# Patient Record
Sex: Male | Born: 1950 | Race: White | Hispanic: No | Smoking: Never smoker
Health system: Southern US, Community
[De-identification: ages and names within clinical notes are randomized; demographics above are authoritative.]

## PROBLEM LIST (undated history)

## (undated) DIAGNOSIS — K219 Gastro-esophageal reflux disease without esophagitis: Secondary | ICD-10-CM

## (undated) DIAGNOSIS — F419 Anxiety disorder, unspecified: Secondary | ICD-10-CM

## (undated) DIAGNOSIS — E782 Mixed hyperlipidemia: Secondary | ICD-10-CM

## (undated) DIAGNOSIS — M549 Dorsalgia, unspecified: Secondary | ICD-10-CM

## (undated) DIAGNOSIS — G8929 Other chronic pain: Secondary | ICD-10-CM

## (undated) DIAGNOSIS — I10 Essential (primary) hypertension: Secondary | ICD-10-CM

## (undated) HISTORY — DX: Anxiety disorder, unspecified: F41.9

## (undated) HISTORY — DX: Essential (primary) hypertension: I10

## (undated) HISTORY — DX: Other chronic pain: G89.29

## (undated) HISTORY — DX: Mixed hyperlipidemia: E78.2

## (undated) HISTORY — DX: Gastro-esophageal reflux disease without esophagitis: K21.9

## (undated) HISTORY — DX: Dorsalgia, unspecified: M54.9

---

## 2001-08-31 ENCOUNTER — Ambulatory Visit (HOSPITAL_COMMUNITY): Admission: RE | Admit: 2001-08-31 | Discharge: 2001-08-31 | Payer: Self-pay | Admitting: General Surgery

## 2003-06-03 ENCOUNTER — Emergency Department (HOSPITAL_COMMUNITY): Admission: EM | Admit: 2003-06-03 | Discharge: 2003-06-03 | Payer: Self-pay | Admitting: Emergency Medicine

## 2005-02-11 ENCOUNTER — Ambulatory Visit (HOSPITAL_COMMUNITY): Admission: RE | Admit: 2005-02-11 | Discharge: 2005-02-11 | Payer: Self-pay | Admitting: Family Medicine

## 2005-02-13 ENCOUNTER — Ambulatory Visit (HOSPITAL_COMMUNITY): Admission: RE | Admit: 2005-02-13 | Discharge: 2005-02-13 | Payer: Self-pay | Admitting: Family Medicine

## 2006-10-14 ENCOUNTER — Ambulatory Visit: Payer: Self-pay | Admitting: Sports Medicine

## 2006-10-14 DIAGNOSIS — M2390 Unspecified internal derangement of unspecified knee: Secondary | ICD-10-CM | POA: Insufficient documentation

## 2006-10-14 DIAGNOSIS — M7710 Lateral epicondylitis, unspecified elbow: Secondary | ICD-10-CM | POA: Insufficient documentation

## 2009-08-06 ENCOUNTER — Ambulatory Visit (HOSPITAL_COMMUNITY): Admission: RE | Admit: 2009-08-06 | Discharge: 2009-08-06 | Payer: Self-pay | Admitting: Family Medicine

## 2010-05-14 NOTE — Assessment & Plan Note (Signed)
Summary: new pt/sports related knee injury/el   Vital Signs:  Patient Profile:   60 Years Old Male Weight:      213 pounds Pulse rate:   73 / minute BP sitting:   139 / 91  Vitals Entered By: Lillia Pauls CMA (October 14, 2006 9:51 AM)               Chief Complaint:  NEW PT/R MEDIAL KNEE PAIN X 1 MONTH.  History of Present Illness: Patient presents for evaluation of right knee pain.  Reports sustaining a contusion to the medial aspect of his right knee 5 months ago - supportive care. Reports approx 4 weeks ago twisted his knee - into valgus - felt pain.  Noted swelling but no erythema, ecchymosis.  No mechanical symptoms.         Shoulder/Elbow Exam  Elbow Exam:    Right:    Inspection:  Normal    Palpation:  Abnormal       Location:  right lateral epicondyle    Tenderness:  right lateral epicondyle    Erythema:  no    pain with resisted wrist extension   Knee Exam  General:    Well-developed, well-nourished, in no acute distress; alert and oriented x 3.    Gait:    Normal heel-toe gait pattern bilaterally.    Skin:    Intact with no erythema; no scarring.    Inspection:    mild swelling: on right   Palpation:    tenderness R-medial joint line.    Vascular:    dorsalis pedis and posterior tibial pulses 2+ and symmetric, capillary refill < 2 seconds, normal hair pattern, no evidence of ischemia.   Knee Exam:    Right:    Inspection/Palpation:  mild effusion, no erythema, no ecchymosis.  Positive flexion pinch, positive medial joint line tenderness.  McMurrays equivical.  Neg Lachman, ant drawer.  Knee stable to valgus and varus stress testing.    Left:    Inspection:  Normal    Palpation:  Normal    Impression & Recommendations:  Problem # 1:  INTERNAL DERANGEMENT, RIGHT KNEE (ICD-717.9) Assessment: New 1.  Right knee brace - given to patient - to be used with activity. 2.  NSAID - Voltaren 75 mg by mouth two times a day two times a day with food  x 2 weeks then as needed. 3.  Glucosamine Chondrotin daily. 4.  Activity modification, icing two times a day. 5.  RTC 4-6 weeks.  Problem # 2:  LATERAL EPICONDYLITIS, RIGHT (ICD-726.32) 1.  right counterforce strap. 2.  NSAID - Voltaren 75 mg by mouth two times a day two times a day with food x 2 weeks then as needed. 3.  Activity modification, icing two times a day. 4.  RTC 4-6 weeks.          Appended Document: new pt/sports related knee injury/el seen with dr. Zachery Dauer.  agree with plan//r bassett

## 2010-08-30 NOTE — H&P (Signed)
NAME:  Matthew Cohen, Matthew Cohen                          ACCOUNT NO.:  [id number]   MEDICAL RECORD NO.:  [id number]                   PATIENT TYPE:  EMS   LOCATION:  ED                                   FACILITY:  [facility]   PHYSICIAN:  Madelin Rear. Sherwood Gambler, M.D.             DATE OF BIRTH:  [birth year]   DATE OF ADMISSION:  06/03/2003  DATE OF DISCHARGE:                                HISTORY & PHYSICAL   CHIEF COMPLAINT:  Chest pain.   HISTORY OF PRESENT ILLNESS:  The patient had two days of intermittent left  upper extremity discomfort and chest discomfort. It spontaneously waxed and  waned. However, today it got worse with much pressure-like sensation  describing it as a ton of bricks in the center of my chest.  He denied any  associated cardiopulmonary symptoms of palpitations, syncope, nausea,  vomiting, or diaphoresis. No shortness of breath, cough, or sputum. He has  had no hematochezia or melena. No nausea or vomiting. Regarding cardiac risk  factors, he denies cigarette smoking. Family history has been negative.   PAST MEDICAL HISTORY:  Gastroesophageal reflux disease treated well with  antacids and without recurrence. He denies recent increase in reflux  symptomatology. He was noted to have recently exerted himself and during  that exertion moving a daughter from one location to another did a lot of  walking, etc., and noted no exertional onset of the chest discomfort.   SOCIAL HISTORY:  Does not smoke, drink, or use other drugs.   FAMILY HISTORY:  Positive for diabetes mellitus, but otherwise  noncontributory.   REVIEW OF SYSTEMS:  Under HPI and negative in detail.   PHYSICAL EXAMINATION:  GENERAL: Awake, alert, pleasant, and pain free.  HEAD/NECK:  No JVD or adenopathy. Neck is supple.  CHEST: Clear.  CARDIAC: Regular rate and rhythm without murmurs, rubs, or gallops. Carotids  are free of bruits.  ABDOMEN: Soft. No organomegaly or masses. No bruits or palpable masses. No  increased liver, spleen, or kidney.  EXTREMITIES: Without clubbing, cyanosis, or edema.  NEUROLOGIC: Nonfocal.   LABORATORY DATA:  Electrocardiogram reveals a normal sinus rhythm and within  normal limits.  Chest x-ray was obtained, portable technique, and was  negative.   Initial cardiac enzymes were negative.  CBC was unremarkable.  Electrolytes  were normal.   IMPRESSION:  Unexplained chest pain in a middle-age male. Must rule out new  onset angina versus unstable angina versus non-Q-wave infarct. His  electrocardiogram is done with no pain and is within normal limits in spite  of the computer reading of reading an anteroseptal infarct. His reflux  disease may be relevant since esophageal spasm  is in the differential diagnostic possibilities or reflux. He will be begun  on proton pump inhibitor and started on antiplatelet therapy with aspirin in  anticipation of any coronary artery disease. We will admit him for rule out  myocardial infarction protocol, a  consultation with cardiology as indicated  by his clinical status.     ___________________________________________                                         Madelin Rear. Sherwood Gambler, M.D.   LJF/MEDQ  D:  06/03/2003  T:  06/03/2003  Job:  [id number]

## 2011-05-18 IMAGING — US US SCROTUM
1 series · 14 of 25 positions shown · non-contrast
Comparison: None.

CLINICAL DATA: Right testicular pain/history of right inguinal
hernia

SCROTAL ULTRASOUND
DOPPLER ULTRASOUND OF THE TESTICLES
TECHNIQUE: Complete ultrasound examination of the testicles,
epididymis, and other scrotal structures was performed.  Color and
spectral Doppler ultrasound were also utilized to evaluate blood
flow to the testicles.

[Series 1: us scrotum · 0.08mm/px · 74 acquisitions, 14 frames shown]
[im 1/74]
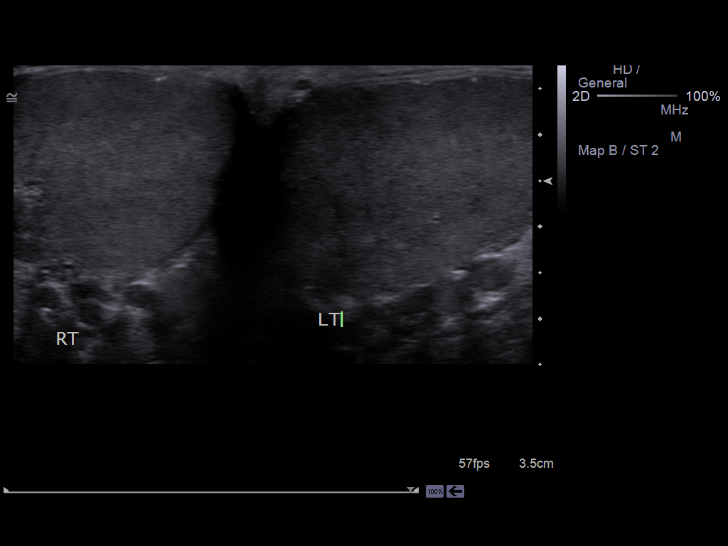
[im 7/74]
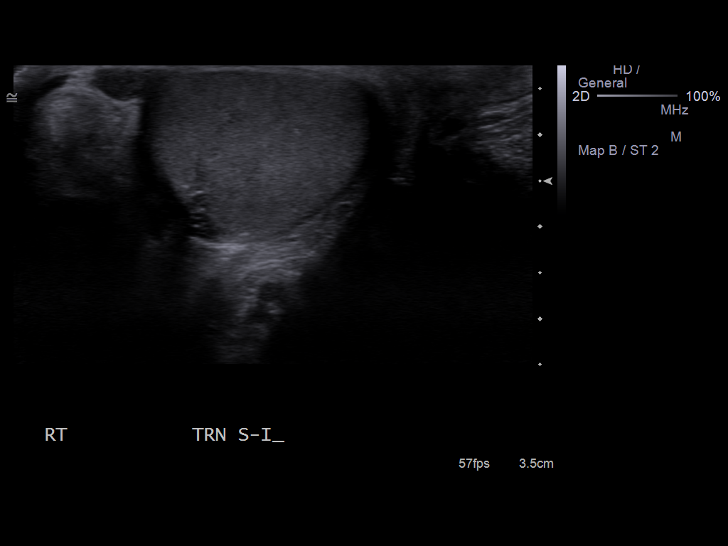
[im 13/74]
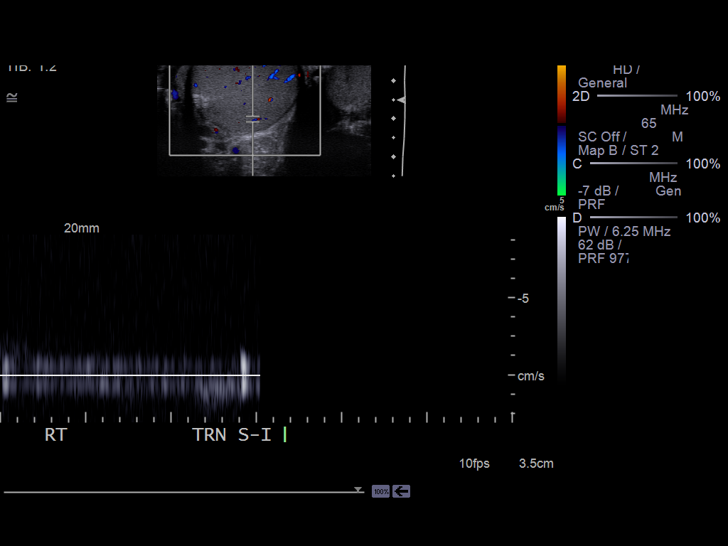
[im 19/74]
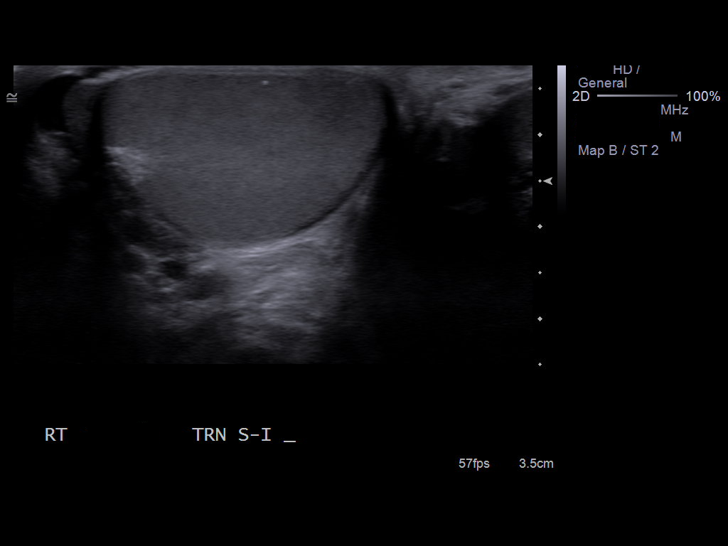
[im 25/74]
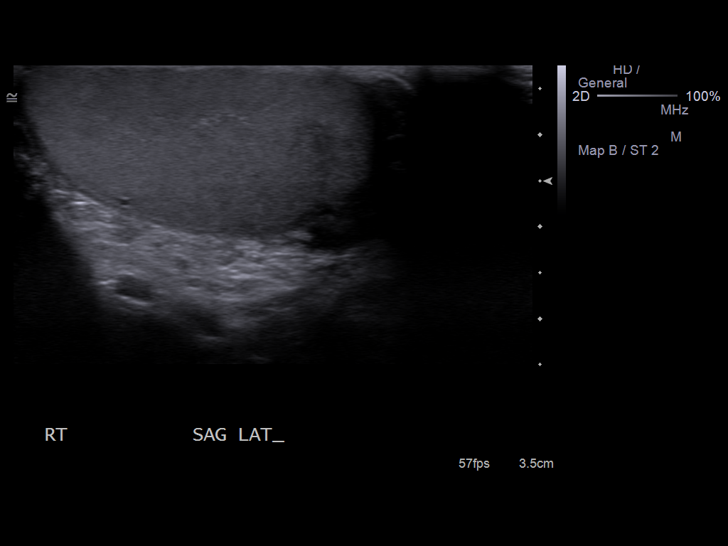
[im 28/74]
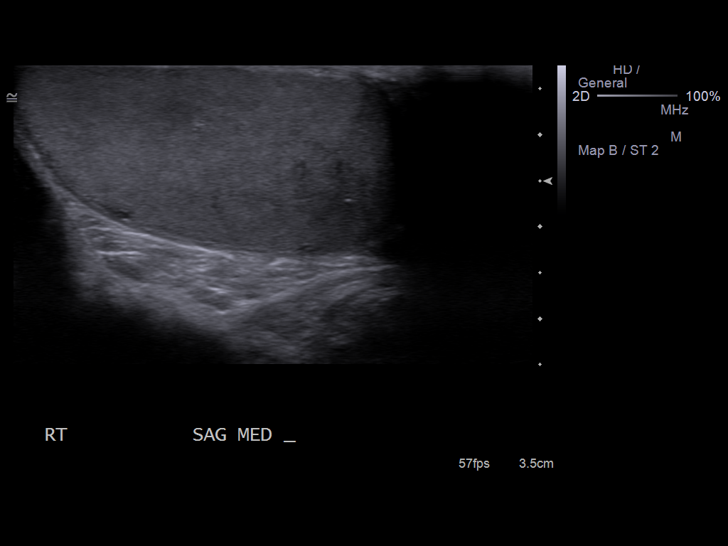
[im 34/74]
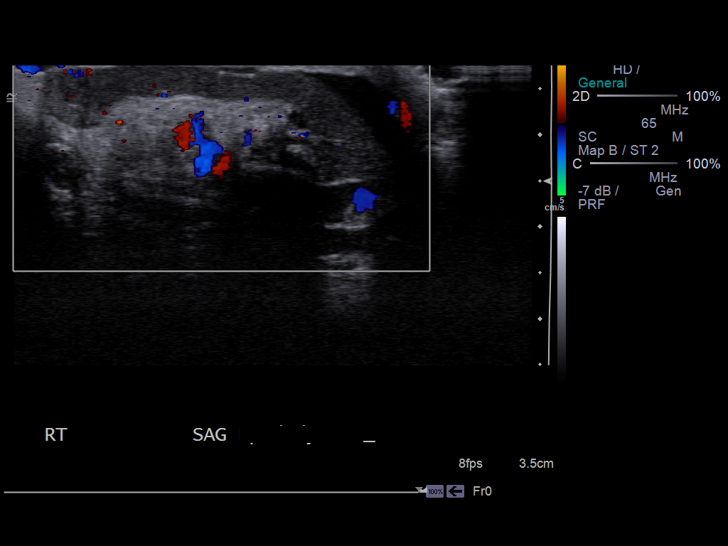
[im 40/74]
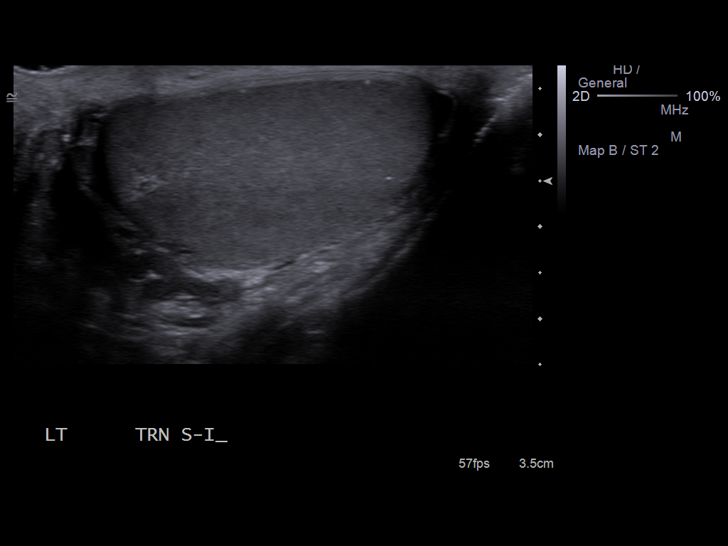
[im 46/74]
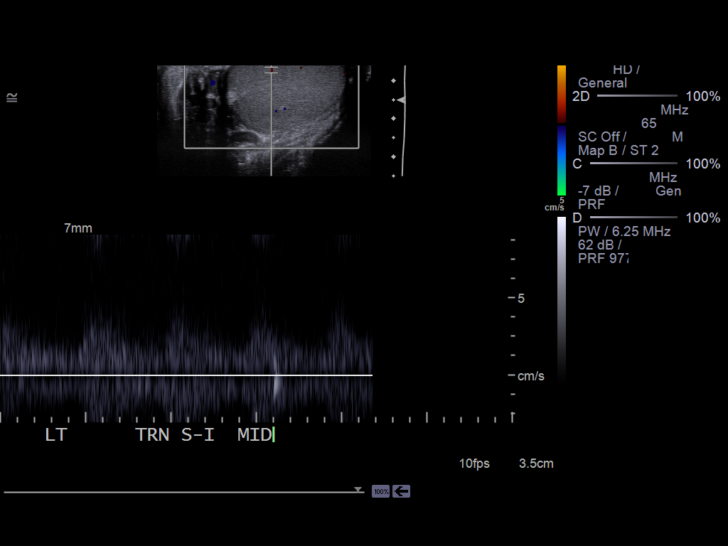
[im 49/74]
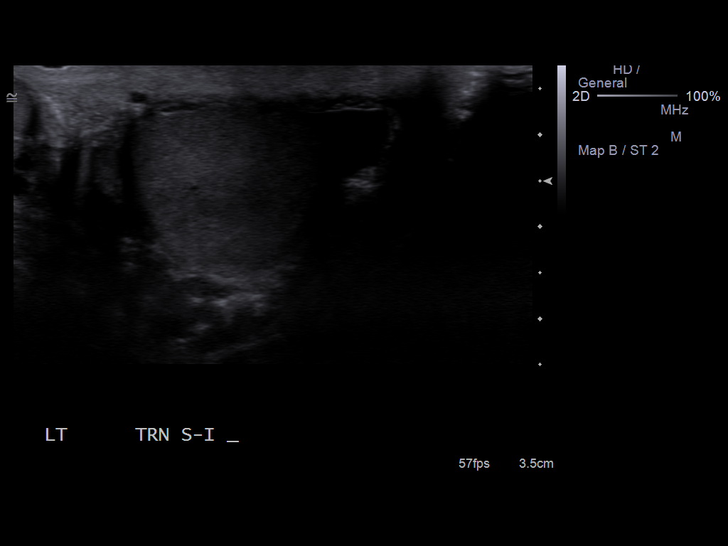
[im 55/74]
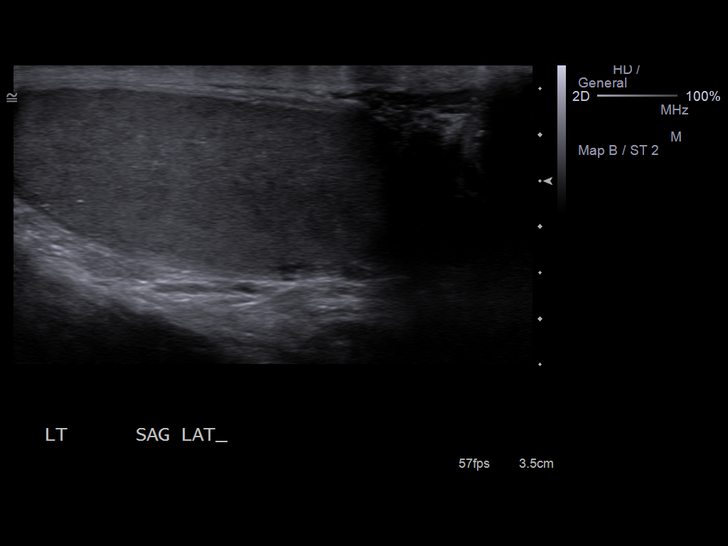
[im 61/74]
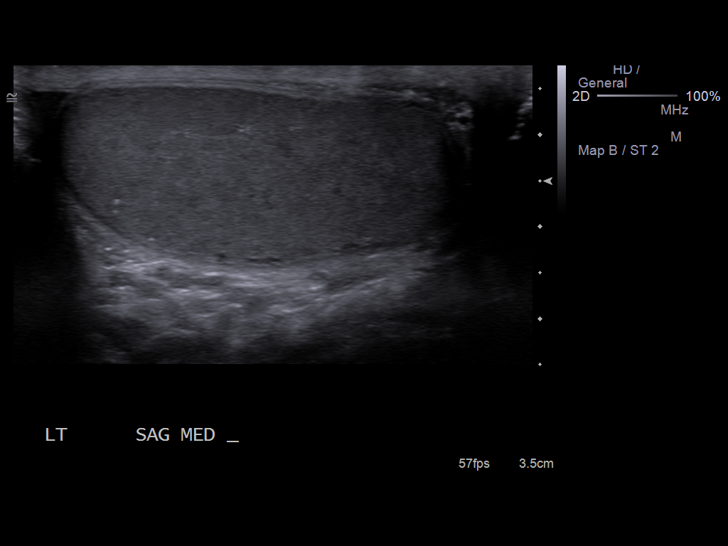
[im 67/74]
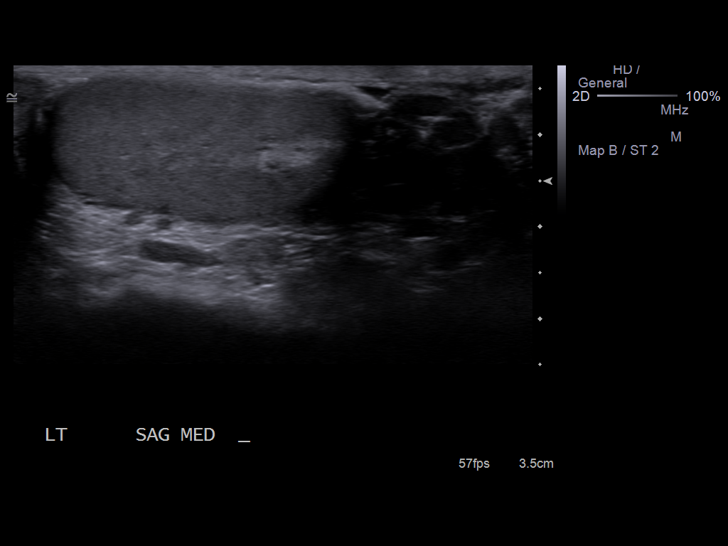
[im 74/74]
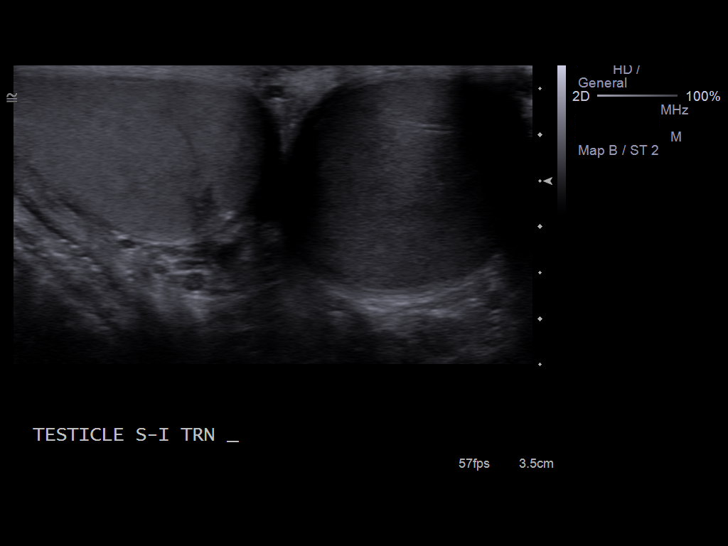

[14 of 25 positions shown; findings below may reference images not displayed]

FINDINGS: No testicular mass or other significant lesions.  There
are scattered bilateral calcifications in the testicles but not
insufficient in number to be of concern.

There is a small amount of scrotal fluid on the right but there is
no evidence for bowel or other mass effect within the scrotal sac
that would suggest inguinal hernia.  No evidence for epididymitis.
There is a fairly prominent varicocele on the left.
IMPRESSION: 1.  No evidence for bowel or significant fluid in the right scrotal
sac.
2.  There are a few bilateral testicular calcifications, felt to be
of no clinical significance.
3.  A varicocele is noted on the left.

## 2011-06-09 DIAGNOSIS — R079 Chest pain, unspecified: Secondary | ICD-10-CM

## 2011-06-11 DIAGNOSIS — I38 Endocarditis, valve unspecified: Secondary | ICD-10-CM

## 2013-12-28 ENCOUNTER — Encounter: Payer: Self-pay | Admitting: Cardiology

## 2014-01-03 ENCOUNTER — Encounter: Payer: Self-pay | Admitting: *Deleted

## 2014-01-04 ENCOUNTER — Encounter: Payer: Self-pay | Admitting: Cardiology

## 2014-01-04 ENCOUNTER — Ambulatory Visit (INDEPENDENT_AMBULATORY_CARE_PROVIDER_SITE_OTHER): Payer: Self-pay | Admitting: Cardiology

## 2014-01-04 ENCOUNTER — Encounter: Payer: Self-pay | Admitting: *Deleted

## 2014-01-04 ENCOUNTER — Other Ambulatory Visit: Payer: Self-pay | Admitting: Cardiology

## 2014-01-04 VITALS — BP 150/93 | HR 62 | Ht 71.0 in | Wt 217.0 lb

## 2014-01-04 DIAGNOSIS — R9439 Abnormal result of other cardiovascular function study: Secondary | ICD-10-CM | POA: Insufficient documentation

## 2014-01-04 DIAGNOSIS — R072 Precordial pain: Secondary | ICD-10-CM

## 2014-01-04 DIAGNOSIS — E782 Mixed hyperlipidemia: Secondary | ICD-10-CM | POA: Insufficient documentation

## 2014-01-04 DIAGNOSIS — I10 Essential (primary) hypertension: Secondary | ICD-10-CM | POA: Insufficient documentation

## 2014-01-04 NOTE — Assessment & Plan Note (Signed)
This has been followed by Dr. Woody Seller. I do not have recent results in terms of lab work. He reports a history of statin intolerance. If significant CAD is documented at heart catheterization, clearly with this will need to be revisited.

## 2014-01-04 NOTE — Patient Instructions (Signed)

## 2014-01-04 NOTE — Assessment & Plan Note (Signed)
Patient reports symptoms with typical and atypical features, also associated with anxiety and generalized worry about his heart. Recent hospitalization at [facility] reviewed, cardiac markers were normal, ECG without acute findings. Prior cardiovascular workup in 2013 revealed an equivocal Cardiolite with the potential for inferior/inferoseptal ischemia in the setting of artifact, normal LVEF. Cardiac risk factors include age and gender, reported hyperlipidemia with statin intolerance, and hypertension on no specific medical treatment. We have discussed these issues, and plan is to proceed with a diagnostic heart catheterization via radial approach for clear definition of his coronary anatomy to help guide treatment options. We discussed the risks and benefits. Hopefully, he will be found to have reassuring results. I recommended that he continue an aspirin daily for now. Procedure being scheduled with Dr. Irish Lack on Friday. He just had lab work and a chest x-ray at [facility], results in [place].

## 2014-01-04 NOTE — Assessment & Plan Note (Signed)
Blood pressure is elevated today. Patient very hesitant to take any medications. States he focuses on diet and exercise.

## 2014-01-04 NOTE — Progress Notes (Signed)
Clinical Summary Matthew Cohen is a 63 y.o.male referred for cardiology consultation by Dr. Woody Seller. Records indicate that he was seen in consultation by Dr. Fletcher Anon and Mr. Jacqualine Mau PA-C back in February 2013 at [facility] with chest pain. At that time he ruled out for myocardial iinfarction, was noted to have leukocytosis of uncertain etiology, there was additional concern for possible infectious process. He underwent cardiac testing to include echocardiogram, TEE, and an exercise Cardiolite. He was found to have normal LVEF of 55-60% with grade 2 diastolic dysfunction, nodular calcification of the noncoronary aortic cusp (no vegetation by TEE which had been a concern), and no other significant valvular abnormalities or evidence of intracardiac thrombi. Stress testing showed no diagnostic ST segment changes at maximum workload 7 METS, and perfusion imaging raising the possibility of inferior/inferoseptal ischemia although images were affected by subdiaphragmatic radiotracer uptake. Does not look like further cardiac workup was pursued at that point.  More recently he was admitted to [facility] within the last week, managed by Dr. Woody Seller. Symptoms described in the records include nausea and atypical chest discomfort. He ruled out for myocardial infarction, no additional cardiac testing was undertaken and he is referred here today.  He tells me that last week he began to experience a feeling of diaphoresis and nausea of sudden onset, was recurrent and also associated with anxiety, additionally felt a generalized tightness in his chest. He states he has had some recurrence of these symptoms following hospital stay. He admits that he is very anxious, and is concerned about the development of ischemic heart disease. His father has history of CAD status post CABG, although at older age.  Recent lab work showed BUN 16, creatinine 1.1, potassium 4.2, normal troponin I, negative d-dimer, BNP 19, hemoglobin  14.4, platelets 227. Chest x-ray reported no acute cardiopulmonary process. ECG showed normal sinus rhythm with leftward axis.  Of note, Matthew Cohen is very hesitant to take medications long-term. He now is in fact on no specific medicine other than an aspirin daily. He has a history of statin intolerance, states he tries to manage things through eating organic foods and exercise.   No Known Allergies  Current Outpatient Prescriptions  Medication Sig Dispense Refill  . aspirin EC 81 MG tablet Take 81 mg by mouth daily.       No current facility-administered medications for this visit.    Past Medical History  Diagnosis Date  . Essential hypertension, benign   . Mixed dyslipidemia     Statin intolerance  . GERD (gastroesophageal reflux disease)   . Anxiety disorder   . Chronic back pain     History reviewed. No pertinent past surgical history.  Family History  Problem Relation Age of Onset  . Dementia Mother   . Coronary artery disease Father     CABG at age 82    Social History Matthew Cohen reports that he has never smoked. He does not have any smokeless tobacco history on file. Matthew Cohen reports that he does not drink alcohol.  Review of Systems Other systems reviewed and negative.  Physical Examination Filed Vitals:   01/04/14 0925  BP: 150/93  Pulse: 62   Filed Weights   01/04/14 0925  Weight: 217 lb (98.431 kg)   Well-developed male, appears comfortable at rest. HEENT: Wearing glasses. Conjunctiva and lids normal, oropharynx clear with moist mucosa. Neck: Supple, no elevated JVP or carotid bruits, no thyromegaly. Lungs: Clear to auscultation, nonlabored breathing at rest. Cardiac: Regular  rate and rhythm, no S3, very soft significant systolic murmur, no pericardial rub. Abdomen: Soft, nontender, bowel sounds present, no guarding or rebound. Extremities: No pitting edema, distal pulses 2+. Skin: Warm and dry. Musculoskeletal: No kyphosis. Neuropsychiatric:  Alert and oriented x3, affect grossly appropriate.   Problem List and Plan   Precordial pain Patient reports symptoms with typical and atypical features, also associated with anxiety and generalized worry about his heart. Recent hospitalization at [facility] reviewed, cardiac markers were normal, ECG without acute findings. Prior cardiovascular workup in 2013 revealed an equivocal Cardiolite with the potential for inferior/inferoseptal ischemia in the setting of artifact, normal LVEF. Cardiac risk factors include age and gender, reported hyperlipidemia with statin intolerance, and hypertension on no specific medical treatment. We have discussed these issues, and plan is to proceed with a diagnostic heart catheterization via radial approach for clear definition of his coronary anatomy to help guide treatment options. We discussed the risks and benefits. Hopefully, he will be found to have reassuring results. I recommended that he continue an aspirin daily for now. Procedure being scheduled with Dr. Irish Lack on Friday. He just had lab work and a chest x-ray at [facility], results in [place].  Mixed hyperlipidemia This has been followed by Dr. Woody Seller. I do not have recent results in terms of lab work. He reports a history of statin intolerance. If significant CAD is documented at heart catheterization, clearly with this will need to be revisited.  Essential hypertension, benign Blood pressure is elevated today. Patient very hesitant to take any medications. States he focuses on diet and exercise.    Satira Sark, M.D., F.A.C.C.

## 2014-01-05 ENCOUNTER — Encounter (HOSPITAL_COMMUNITY): Payer: Self-pay | Admitting: Pharmacy Technician

## 2014-01-06 ENCOUNTER — Ambulatory Visit (HOSPITAL_COMMUNITY)
Admission: RE | Admit: 2014-01-06 | Discharge: 2014-01-06 | Disposition: A | Discharge status: Home / Self Care | Payer: Self-pay | Source: Ambulatory Visit | Attending: Interventional Cardiology | Admitting: Interventional Cardiology

## 2014-01-06 ENCOUNTER — Encounter (HOSPITAL_COMMUNITY)
Admission: RE | Disposition: A | Discharge status: Home / Self Care | Payer: Self-pay | Source: Ambulatory Visit | Attending: Interventional Cardiology

## 2014-01-06 DIAGNOSIS — R072 Precordial pain: Secondary | ICD-10-CM

## 2014-01-06 DIAGNOSIS — R079 Chest pain, unspecified: Secondary | ICD-10-CM | POA: Insufficient documentation

## 2014-01-06 DIAGNOSIS — Z7982 Long term (current) use of aspirin: Secondary | ICD-10-CM | POA: Insufficient documentation

## 2014-01-06 DIAGNOSIS — I2510 Atherosclerotic heart disease of native coronary artery without angina pectoris: Secondary | ICD-10-CM | POA: Insufficient documentation

## 2014-01-06 DIAGNOSIS — K219 Gastro-esophageal reflux disease without esophagitis: Secondary | ICD-10-CM | POA: Insufficient documentation

## 2014-01-06 DIAGNOSIS — R9439 Abnormal result of other cardiovascular function study: Secondary | ICD-10-CM | POA: Insufficient documentation

## 2014-01-06 DIAGNOSIS — I10 Essential (primary) hypertension: Secondary | ICD-10-CM | POA: Insufficient documentation

## 2014-01-06 DIAGNOSIS — E782 Mixed hyperlipidemia: Secondary | ICD-10-CM | POA: Insufficient documentation

## 2014-01-06 DIAGNOSIS — Z8249 Family history of ischemic heart disease and other diseases of the circulatory system: Secondary | ICD-10-CM | POA: Insufficient documentation

## 2014-01-06 HISTORY — PX: LEFT HEART CATHETERIZATION WITH CORONARY ANGIOGRAM: SHX5451

## 2014-01-06 LAB — CK TOTAL AND CKMB (NOT AT ARMC)
CK, MB: 2.5 ng/mL (ref 0.3–4.0)
Relative Index: 1.7 (ref 0.0–2.5)
Total CK: 150 U/L (ref 7–232)

## 2014-01-06 LAB — PROTIME-INR
INR: 0.96 (ref 0.00–1.49)
Prothrombin Time: 12.8 seconds (ref 11.6–15.2)

## 2014-01-06 SURGERY — LEFT HEART CATHETERIZATION WITH CORONARY ANGIOGRAM
Anesthesia: LOCAL

## 2014-01-06 MED ORDER — ASPIRIN 81 MG PO CHEW
CHEWABLE_TABLET | ORAL | Status: AC
  Administered 2014-01-06: 81 mg via ORAL
  Filled 2014-01-06: qty 1

## 2014-01-06 MED ORDER — ASPIRIN 81 MG PO CHEW
81.0000 mg | CHEWABLE_TABLET | ORAL | Status: AC
  Administered 2014-01-06: 81 mg via ORAL

## 2014-01-06 MED ORDER — FENTANYL CITRATE 0.05 MG/ML IJ SOLN
INTRAMUSCULAR | Status: AC
  Filled 2014-01-06: qty 2

## 2014-01-06 MED ORDER — MIDAZOLAM HCL 2 MG/2ML IJ SOLN
INTRAMUSCULAR | Status: AC
  Filled 2014-01-06: qty 2

## 2014-01-06 MED ORDER — SODIUM CHLORIDE 0.9 % IV SOLN
INTRAVENOUS | Status: DC
  Administered 2014-01-06: 08:00:00 via INTRAVENOUS

## 2014-01-06 MED ORDER — LIDOCAINE HCL (PF) 1 % IJ SOLN
INTRAMUSCULAR | Status: AC
  Filled 2014-01-06: qty 30

## 2014-01-06 MED ORDER — SODIUM CHLORIDE 0.9 % IV SOLN: 250.0000 mL | INTRAVENOUS | Status: DC | PRN

## 2014-01-06 MED ORDER — SODIUM CHLORIDE 0.9 % IV SOLN: 1.0000 mL/kg/h | INTRAVENOUS | Status: DC

## 2014-01-06 MED ORDER — HEPARIN (PORCINE) IN NACL 2-0.9 UNIT/ML-% IJ SOLN
INTRAMUSCULAR | Status: AC
  Filled 2014-01-06: qty 1000

## 2014-01-06 MED ORDER — SODIUM CHLORIDE 0.9 % IJ SOLN: 3.0000 mL | INTRAMUSCULAR | Status: DC | PRN

## 2014-01-06 MED ORDER — HEPARIN SODIUM (PORCINE) 1000 UNIT/ML IJ SOLN
INTRAMUSCULAR | Status: AC
  Filled 2014-01-06: qty 1

## 2014-01-06 MED ORDER — SODIUM CHLORIDE 0.9 % IJ SOLN: 3.0000 mL | Freq: Two times a day (BID) | INTRAMUSCULAR | Status: DC

## 2014-01-06 MED ORDER — VERAPAMIL HCL 2.5 MG/ML IV SOLN
INTRAVENOUS | Status: AC
  Filled 2014-01-06: qty 2

## 2014-01-06 NOTE — Interval H&P Note (Signed)
Cath Lab Visit (complete for each Cath Lab visit)  Clinical Evaluation Leading to the Procedure:   ACS: No.  Non-ACS:    Anginal Classification: CCS III  Anti-ischemic medical therapy: No Therapy  Non-Invasive Test Results: Intermediate-risk stress test findings: cardiac mortality 1-3%/year  Prior CABG: No previous CABG      History and Physical Interval Note:  01/06/2014 10:25 AM  Matthew Cohen  has presented today for surgery, with the diagnosis of angina  The various methods of treatment have been discussed with the patient and family. After consideration of risks, benefits and other options for treatment, the patient has consented to  Procedure(s): LEFT HEART CATHETERIZATION WITH CORONARY ANGIOGRAM (N/A) as a surgical intervention .  The patient's history has been reviewed, patient examined, no change in status, stable for surgery.  I have reviewed the patient's chart and labs.  Questions were answered to the patient's satisfaction.     VARANASI,JAYADEEP S.

## 2014-01-06 NOTE — Discharge Instructions (Signed)
Follow post radial cath instructionsRadial Site Care Refer to this sheet in the next few weeks. These instructions provide you with information on caring for yourself after your procedure. Your caregiver may also give you more specific instructions. Your treatment has been planned according to current medical practices, but problems sometimes occur. Call your caregiver if you have any problems or questions after your procedure. HOME CARE INSTRUCTIONS  You may shower the day after the procedure.Remove the bandage (dressing) and gently wash the site with plain soap and water.Gently pat the site dry.  Do not apply powder or lotion to the site.  Do not submerge the affected site in water for 3 to 5 days.  Inspect the site at least twice daily.  Do not flex or bend the affected arm for 24 hours.  No lifting over 5 pounds (2.3 kg) for 5 days after your procedure.  Do not drive home if you are discharged the same day of the procedure. Have someone else drive you.  You may drive 24 hours after the procedure unless otherwise instructed by your caregiver.  Do not operate machinery or power tools for 24 hours.  A responsible adult should be with you for the first 24 hours after you arrive home. What to expect:  Any bruising will usually fade within 1 to 2 weeks.  Blood that collects in the tissue (hematoma) may be painful to the touch. It should usually decrease in size and tenderness within 1 to 2 weeks. SEEK IMMEDIATE MEDICAL CARE IF:  You have unusual pain at the radial site.  You have redness, warmth, swelling, or pain at the radial site.  You have drainage (other than a small amount of blood on the dressing).  You have chills.  You have a fever or persistent symptoms for more than 72 hours.  You have a fever and your symptoms suddenly get worse.  Your arm becomes pale, cool, tingly, or numb.  You have heavy bleeding from the site. Hold pressure on the site. CALL 911 Document  Released: 05/03/2010 Document Revised: 06/23/2011 Document Reviewed: 05/03/2010 [facility] Patient Information 2015 [place], [place]. This information is not intended to replace advice given to you by your health care provider. Make sure you discuss any questions you have with your health care provider.

## 2014-01-06 NOTE — H&P (View-Only) (Signed)
Clinical Summary Matthew Cohen is a 62 y.o.male referred for cardiology consultation by Dr. Woody Seller. Records indicate that he was seen in consultation by Dr. Fletcher Anon and Mr. Jacqualine Mau PA-C back in February 2013 at [facility] with chest pain. At that time he ruled out for myocardial iinfarction, was noted to have leukocytosis of uncertain etiology, there was additional concern for possible infectious process. He underwent cardiac testing to include echocardiogram, TEE, and an exercise Cardiolite. He was found to have normal LVEF of 55-60% with grade 2 diastolic dysfunction, nodular calcification of the noncoronary aortic cusp (no vegetation by TEE which had been a concern), and no other significant valvular abnormalities or evidence of intracardiac thrombi. Stress testing showed no diagnostic ST segment changes at maximum workload 7 METS, and perfusion imaging raising the possibility of inferior/inferoseptal ischemia although images were affected by subdiaphragmatic radiotracer uptake. Does not look like further cardiac workup was pursued at that point.  More recently he was admitted to [facility] within the last week, managed by Dr. Woody Seller. Symptoms described in the records include nausea and atypical chest discomfort. He ruled out for myocardial infarction, no additional cardiac testing was undertaken and he is referred here today.  He tells me that last week he began to experience a feeling of diaphoresis and nausea of sudden onset, was recurrent and also associated with anxiety, additionally felt a generalized tightness in his chest. He states he has had some recurrence of these symptoms following hospital stay. He admits that he is very anxious, and is concerned about the development of ischemic heart disease. His father has history of CAD status post CABG, although at older age.  Recent lab work showed BUN 16, creatinine 1.1, potassium 4.2, normal troponin I, negative d-dimer, BNP 19, hemoglobin  14.4, platelets 227. Chest x-ray reported no acute cardiopulmonary process. ECG showed normal sinus rhythm with leftward axis.  Of note, Matthew Cohen is very hesitant to take medications long-term. He now is in fact on no specific medicine other than an aspirin daily. He has a history of statin intolerance, states he tries to manage things through eating organic foods and exercise.   No Known Allergies  Current Outpatient Prescriptions  Medication Sig Dispense Refill  . aspirin EC 81 MG tablet Take 81 mg by mouth daily.       No current facility-administered medications for this visit.    Past Medical History  Diagnosis Date  . Essential hypertension, benign   . Mixed dyslipidemia     Statin intolerance  . GERD (gastroesophageal reflux disease)   . Anxiety disorder   . Chronic back pain     History reviewed. No pertinent past surgical history.  Family History  Problem Relation Age of Onset  . Dementia Mother   . Coronary artery disease Father     CABG at age 27    Social History Matthew Cohen reports that he has never smoked. He does not have any smokeless tobacco history on file. Matthew Cohen reports that he does not drink alcohol.  Review of Systems Other systems reviewed and negative.  Physical Examination Filed Vitals:   01/04/14 0925  BP: 150/93  Pulse: 62   Filed Weights   01/04/14 0925  Weight: 217 lb (98.431 kg)   Well-developed male, appears comfortable at rest. HEENT: Wearing glasses. Conjunctiva and lids normal, oropharynx clear with moist mucosa. Neck: Supple, no elevated JVP or carotid bruits, no thyromegaly. Lungs: Clear to auscultation, nonlabored breathing at rest. Cardiac: Regular  rate and rhythm, no S3, very soft significant systolic murmur, no pericardial rub. Abdomen: Soft, nontender, bowel sounds present, no guarding or rebound. Extremities: No pitting edema, distal pulses 2+. Skin: Warm and dry. Musculoskeletal: No kyphosis. Neuropsychiatric:  Alert and oriented x3, affect grossly appropriate.   Problem List and Plan   Precordial pain Patient reports symptoms with typical and atypical features, also associated with anxiety and generalized worry about his heart. Recent hospitalization at [facility] reviewed, cardiac markers were normal, ECG without acute findings. Prior cardiovascular workup in 2013 revealed an equivocal Cardiolite with the potential for inferior/inferoseptal ischemia in the setting of artifact, normal LVEF. Cardiac risk factors include age and gender, reported hyperlipidemia with statin intolerance, and hypertension on no specific medical treatment. We have discussed these issues, and plan is to proceed with a diagnostic heart catheterization via radial approach for clear definition of his coronary anatomy to help guide treatment options. We discussed the risks and benefits. Hopefully, he will be found to have reassuring results. I recommended that he continue an aspirin daily for now. Procedure being scheduled with Dr. Irish Lack on Friday. He just had lab work and a chest x-ray at [facility], results in [place].  Mixed hyperlipidemia This has been followed by Dr. Woody Seller. I do not have recent results in terms of lab work. He reports a history of statin intolerance. If significant CAD is documented at heart catheterization, clearly with this will need to be revisited.  Essential hypertension, benign Blood pressure is elevated today. Patient very hesitant to take any medications. States he focuses on diet and exercise.    Satira Sark, M.D., F.A.C.C.

## 2014-01-06 NOTE — CV Procedure (Signed)
       PROCEDURE:  Left heart catheterization with selective coronary angiography, left ventriculogram.  Right upper extremity angiogram.  INDICATIONS:  Abnormal stress test  The risks, benefits, and details of the procedure were explained to the patient.  The patient verbalized understanding and wanted to proceed.  Informed written consent was obtained.  PROCEDURE TECHNIQUE:  After Xylocaine anesthesia a 74F slender sheath was placed in the right radial artery with a single anterior needle wall stick.  A right upper extremity angiogram was performed through the JR 4 catheter in the right shoulder area. There is difficulty navigating through the tortuosity. A versa core wire was eventually used successfully. IV heparin was given. Right coronary angiography was done using a Judkins R4 guide catheter.  Left coronary angiography was done using a Judkins L3.5 guide catheter.  Left ventriculography was done using a pigtail catheter.  A TR band was used for hemostasis.   CONTRAST:  Total of 75 cc.  COMPLICATIONS:  None.    HEMODYNAMICS:  Aortic pressure was 118/66; LV pressure was 116/2; LVEDP 7.  There was no gradient between the left ventricle and aorta.    ANGIOGRAPHIC DATA:   The left main coronary artery is widely patent.  The left anterior descending artery is a large vessel which wraps around the apex. There is a large first diagonal which is widely patent. There is mild eccentric atherosclerosis just after the first diagonal. The remainder of the mid to distal LAD is widely patent.  The left circumflex artery is a large vessel. The ramus vessel is he medium size and patent. The OM1 is medium size and widely patent. The OM 2 is small but patent. There is a large OM 3 which is widely patent.  The right coronary artery is a medium size, dominant vessel. The posterior lateral artery and posterior descending artery are widely patent.  There is no obstructive disease in the RCA system.  Right  upper extremity angiogram: There is tortuosity in the right subclavian area but no significant obstruction.  LEFT VENTRICULOGRAM:  Left ventricular angiogram was done in the 30 RAO projection and revealed normal left ventricular wall motion and systolic function with an estimated ejection fraction of 55%.  LVEDP was 7 mmHg.  IMPRESSIONS:  1. Normal left main coronary artery. 2.  Mild atherosclerosis in the mid  left anterior descending artery  without significant obstruction. 3.  Widely patent  left circumflex artery and its branches. 4.  Widely patent right coronary artery. 5. Normal left ventricular systolic function.  LVEDP 7 mmHg.  Ejection fraction 55%.  RECOMMENDATION:  Continue medical therapy. No cardiac source of chest discomfort detected on this study. Continue aggressive preventive therapy. He will followup with Dr. Domenic Polite.

## 2014-01-06 NOTE — Research (Signed)
Bioflow Informed Consent   Subject Name: Matthew Cohen  Subject met inclusion and exclusion criteria.  The informed consent form, study requirements and expectations were reviewed with the subject and questions and concerns were addressed prior to the signing of the consent form.  The subject verbalized understanding of the trail requirements.  The subject agreed to participate in the Bioflow trial and signed the informed consent.  The informed consent was obtained prior to performance of any protocol-specific procedures for the subject.  A copy of the signed informed consent was given to the subject and a copy was placed in the subject's medical record.  Sandie Ano 01/06/2014, 8:04

## 2014-01-27 ENCOUNTER — Ambulatory Visit (INDEPENDENT_AMBULATORY_CARE_PROVIDER_SITE_OTHER): Payer: Self-pay | Admitting: Cardiology

## 2014-01-27 ENCOUNTER — Encounter: Payer: Self-pay | Admitting: Cardiology

## 2014-01-27 VITALS — BP 134/87 | HR 66 | Ht 70.0 in | Wt 217.0 lb

## 2014-01-27 DIAGNOSIS — R072 Precordial pain: Secondary | ICD-10-CM

## 2014-01-27 NOTE — Patient Instructions (Signed)
Continue all current medications. Follow up as needed  

## 2014-01-27 NOTE — Assessment & Plan Note (Signed)
Patient's heart catheterization was overall reassuring with only mild atherosclerosis noted. Would recommend basic risk factor modification, diet and exercise. He will continue on aspirin for now. I asked him to keep a close eye on blood pressure and lipids with Dr. Woody Seller in case medical therapy is indicated. We can see him back as needed.

## 2014-01-27 NOTE — Progress Notes (Signed)
    Clinical Summary Matthew Cohen is a 63 y.o.male seen in consultation in September of this year. Matthew Cohen was referred at that time with chest pain symptoms for a diagnostic cardiac catheterization to clearly define coronary anatomy, having had an equivocal Cardiolite in 2013 and in the face of cardiac risk factors detailed in the prior note.  Cardiac catheterization was performed by Dr. Irish Lack on September 25 demonstrating only mild atherosclerosis in the mid LAD, normal LVEDP of 7 mm mercury, and LVEF 55%. Reassuring results.  Matthew Cohen is very reassured by the test findings. We talked about basic cardiac risk factor modification over time, I have asked him to keep close followup with Dr. Woody Cohen.  No Known Allergies  Current Outpatient Prescriptions  Medication Sig Dispense Refill  . aspirin EC 81 MG tablet Take 81 mg by mouth daily.       No current facility-administered medications for this visit.    Past Medical History  Diagnosis Date  . Essential hypertension, benign   . Mixed dyslipidemia     Statin intolerance  . GERD (gastroesophageal reflux disease)   . Anxiety disorder   . Chronic back pain     Social History Mr. Buist reports that Matthew Cohen has never smoked. Matthew Cohen has never used smokeless tobacco. Mr. Boquet reports that Matthew Cohen does not drink alcohol.  Review of Systems Other systems reviewed and negative.  Physical Examination Filed Vitals:   01/27/14 1348  BP: 134/87  Pulse: 66   Filed Weights   01/27/14 1348  Weight: 217 lb (98.431 kg)    Well-developed male, appears comfortable at rest.  HEENT: Wearing glasses. Conjunctiva and lids normal, oropharynx clear with moist mucosa.  Neck: Supple, no elevated JVP or carotid bruits, no thyromegaly.  Lungs: Clear to auscultation, nonlabored breathing at rest.  Cardiac: Regular rate and rhythm, no S3, very soft significant systolic murmur, no pericardial rub.  Abdomen: Soft, nontender, bowel sounds present, no guarding or rebound.    Extremities: No pitting edema, distal pulses 2+. Radial catheterization site stable.   Problem List and Plan   Precordial pain Patient's heart catheterization was overall reassuring with only mild atherosclerosis noted. Would recommend basic risk factor modification, diet and exercise. Matthew Cohen will continue on aspirin for now. I asked him to keep a close eye on blood pressure and lipids with Dr. Woody Cohen in case medical therapy is indicated. We can see him back as needed.    Satira Sark, M.D., F.A.C.C.

## 2014-03-23 ENCOUNTER — Encounter (HOSPITAL_COMMUNITY): Payer: Self-pay | Admitting: Interventional Cardiology

## 2016-08-12 DIAGNOSIS — Z6830 Body mass index (BMI) 30.0-30.9, adult: Secondary | ICD-10-CM | POA: Diagnosis not present

## 2016-08-12 DIAGNOSIS — M2550 Pain in unspecified joint: Secondary | ICD-10-CM | POA: Diagnosis not present

## 2016-08-12 DIAGNOSIS — R5383 Other fatigue: Secondary | ICD-10-CM | POA: Diagnosis not present

## 2016-08-12 DIAGNOSIS — R7309 Other abnormal glucose: Secondary | ICD-10-CM | POA: Diagnosis not present

## 2016-08-12 DIAGNOSIS — K219 Gastro-esophageal reflux disease without esophagitis: Secondary | ICD-10-CM | POA: Diagnosis not present

## 2016-08-12 DIAGNOSIS — E7800 Pure hypercholesterolemia, unspecified: Secondary | ICD-10-CM | POA: Diagnosis not present

## 2016-08-12 DIAGNOSIS — Z713 Dietary counseling and surveillance: Secondary | ICD-10-CM | POA: Diagnosis not present

## 2016-08-12 DIAGNOSIS — Z299 Encounter for prophylactic measures, unspecified: Secondary | ICD-10-CM | POA: Diagnosis not present

## 2016-09-01 DIAGNOSIS — Z7189 Other specified counseling: Secondary | ICD-10-CM | POA: Diagnosis not present

## 2016-09-01 DIAGNOSIS — R5383 Other fatigue: Secondary | ICD-10-CM | POA: Diagnosis not present

## 2016-09-01 DIAGNOSIS — Z125 Encounter for screening for malignant neoplasm of prostate: Secondary | ICD-10-CM | POA: Diagnosis not present

## 2016-09-01 DIAGNOSIS — E7800 Pure hypercholesterolemia, unspecified: Secondary | ICD-10-CM | POA: Diagnosis not present

## 2016-09-01 DIAGNOSIS — Z79899 Other long term (current) drug therapy: Secondary | ICD-10-CM | POA: Diagnosis not present

## 2016-09-01 DIAGNOSIS — Z6830 Body mass index (BMI) 30.0-30.9, adult: Secondary | ICD-10-CM | POA: Diagnosis not present

## 2016-09-01 DIAGNOSIS — Z0000 Encounter for general adult medical examination without abnormal findings: Secondary | ICD-10-CM | POA: Diagnosis not present

## 2016-09-01 DIAGNOSIS — Z1211 Encounter for screening for malignant neoplasm of colon: Secondary | ICD-10-CM | POA: Diagnosis not present

## 2016-09-01 DIAGNOSIS — K219 Gastro-esophageal reflux disease without esophagitis: Secondary | ICD-10-CM | POA: Diagnosis not present

## 2016-09-01 DIAGNOSIS — Z1389 Encounter for screening for other disorder: Secondary | ICD-10-CM | POA: Diagnosis not present

## 2016-09-01 DIAGNOSIS — Z299 Encounter for prophylactic measures, unspecified: Secondary | ICD-10-CM | POA: Diagnosis not present

## 2016-09-04 DIAGNOSIS — R5383 Other fatigue: Secondary | ICD-10-CM | POA: Diagnosis not present

## 2016-09-04 DIAGNOSIS — E7800 Pure hypercholesterolemia, unspecified: Secondary | ICD-10-CM | POA: Diagnosis not present

## 2016-09-04 DIAGNOSIS — Z79899 Other long term (current) drug therapy: Secondary | ICD-10-CM | POA: Diagnosis not present

## 2016-09-04 DIAGNOSIS — Z125 Encounter for screening for malignant neoplasm of prostate: Secondary | ICD-10-CM | POA: Diagnosis not present

## 2016-09-05 ENCOUNTER — Encounter (INDEPENDENT_AMBULATORY_CARE_PROVIDER_SITE_OTHER): Payer: Self-pay | Admitting: *Deleted

## 2016-09-29 DIAGNOSIS — I10 Essential (primary) hypertension: Secondary | ICD-10-CM | POA: Diagnosis not present

## 2016-09-29 DIAGNOSIS — Z713 Dietary counseling and surveillance: Secondary | ICD-10-CM | POA: Diagnosis not present

## 2016-09-29 DIAGNOSIS — Z299 Encounter for prophylactic measures, unspecified: Secondary | ICD-10-CM | POA: Diagnosis not present

## 2016-09-29 DIAGNOSIS — E7800 Pure hypercholesterolemia, unspecified: Secondary | ICD-10-CM | POA: Diagnosis not present

## 2016-09-29 DIAGNOSIS — K219 Gastro-esophageal reflux disease without esophagitis: Secondary | ICD-10-CM | POA: Diagnosis not present

## 2016-10-02 DIAGNOSIS — Z1211 Encounter for screening for malignant neoplasm of colon: Secondary | ICD-10-CM | POA: Diagnosis not present

## 2016-10-02 DIAGNOSIS — K219 Gastro-esophageal reflux disease without esophagitis: Secondary | ICD-10-CM | POA: Diagnosis not present

## 2016-11-05 DIAGNOSIS — K209 Esophagitis, unspecified: Secondary | ICD-10-CM | POA: Diagnosis not present

## 2016-11-05 DIAGNOSIS — K635 Polyp of colon: Secondary | ICD-10-CM | POA: Diagnosis not present

## 2016-11-05 DIAGNOSIS — K2270 Barrett's esophagus without dysplasia: Secondary | ICD-10-CM | POA: Diagnosis not present

## 2016-11-05 DIAGNOSIS — K219 Gastro-esophageal reflux disease without esophagitis: Secondary | ICD-10-CM | POA: Diagnosis not present

## 2016-11-05 DIAGNOSIS — K621 Rectal polyp: Secondary | ICD-10-CM | POA: Diagnosis not present

## 2016-11-05 DIAGNOSIS — Z1211 Encounter for screening for malignant neoplasm of colon: Secondary | ICD-10-CM | POA: Diagnosis not present

## 2016-11-05 DIAGNOSIS — K317 Polyp of stomach and duodenum: Secondary | ICD-10-CM | POA: Diagnosis not present

## 2016-11-05 DIAGNOSIS — D125 Benign neoplasm of sigmoid colon: Secondary | ICD-10-CM | POA: Diagnosis not present

## 2016-11-05 DIAGNOSIS — D128 Benign neoplasm of rectum: Secondary | ICD-10-CM | POA: Diagnosis not present

## 2016-11-19 DIAGNOSIS — Z713 Dietary counseling and surveillance: Secondary | ICD-10-CM | POA: Diagnosis not present

## 2016-11-19 DIAGNOSIS — Z6830 Body mass index (BMI) 30.0-30.9, adult: Secondary | ICD-10-CM | POA: Diagnosis not present

## 2016-11-19 DIAGNOSIS — I10 Essential (primary) hypertension: Secondary | ICD-10-CM | POA: Diagnosis not present

## 2016-11-19 DIAGNOSIS — E7800 Pure hypercholesterolemia, unspecified: Secondary | ICD-10-CM | POA: Diagnosis not present

## 2016-11-19 DIAGNOSIS — Z299 Encounter for prophylactic measures, unspecified: Secondary | ICD-10-CM | POA: Diagnosis not present

## 2016-12-24 DIAGNOSIS — Z299 Encounter for prophylactic measures, unspecified: Secondary | ICD-10-CM | POA: Diagnosis not present

## 2016-12-24 DIAGNOSIS — E7800 Pure hypercholesterolemia, unspecified: Secondary | ICD-10-CM | POA: Diagnosis not present

## 2016-12-24 DIAGNOSIS — I10 Essential (primary) hypertension: Secondary | ICD-10-CM | POA: Diagnosis not present

## 2016-12-24 DIAGNOSIS — Z6831 Body mass index (BMI) 31.0-31.9, adult: Secondary | ICD-10-CM | POA: Diagnosis not present

## 2016-12-24 DIAGNOSIS — Z713 Dietary counseling and surveillance: Secondary | ICD-10-CM | POA: Diagnosis not present

## 2017-09-09 DIAGNOSIS — R5383 Other fatigue: Secondary | ICD-10-CM | POA: Diagnosis not present

## 2017-09-09 DIAGNOSIS — Z6831 Body mass index (BMI) 31.0-31.9, adult: Secondary | ICD-10-CM | POA: Diagnosis not present

## 2017-09-09 DIAGNOSIS — Z7189 Other specified counseling: Secondary | ICD-10-CM | POA: Diagnosis not present

## 2017-09-09 DIAGNOSIS — K219 Gastro-esophageal reflux disease without esophagitis: Secondary | ICD-10-CM | POA: Diagnosis not present

## 2017-09-09 DIAGNOSIS — Z1339 Encounter for screening examination for other mental health and behavioral disorders: Secondary | ICD-10-CM | POA: Diagnosis not present

## 2017-09-09 DIAGNOSIS — Z0000 Encounter for general adult medical examination without abnormal findings: Secondary | ICD-10-CM | POA: Diagnosis not present

## 2017-09-09 DIAGNOSIS — Z789 Other specified health status: Secondary | ICD-10-CM | POA: Diagnosis not present

## 2017-09-09 DIAGNOSIS — Z1331 Encounter for screening for depression: Secondary | ICD-10-CM | POA: Diagnosis not present

## 2017-09-09 DIAGNOSIS — E7800 Pure hypercholesterolemia, unspecified: Secondary | ICD-10-CM | POA: Diagnosis not present

## 2017-09-09 DIAGNOSIS — Z299 Encounter for prophylactic measures, unspecified: Secondary | ICD-10-CM | POA: Diagnosis not present

## 2017-09-09 DIAGNOSIS — I10 Essential (primary) hypertension: Secondary | ICD-10-CM | POA: Diagnosis not present

## 2017-09-09 DIAGNOSIS — Z79899 Other long term (current) drug therapy: Secondary | ICD-10-CM | POA: Diagnosis not present

## 2017-09-10 DIAGNOSIS — Z125 Encounter for screening for malignant neoplasm of prostate: Secondary | ICD-10-CM | POA: Diagnosis not present

## 2017-09-10 DIAGNOSIS — R5383 Other fatigue: Secondary | ICD-10-CM | POA: Diagnosis not present

## 2017-09-10 DIAGNOSIS — Z79899 Other long term (current) drug therapy: Secondary | ICD-10-CM | POA: Diagnosis not present

## 2017-09-10 DIAGNOSIS — E7800 Pure hypercholesterolemia, unspecified: Secondary | ICD-10-CM | POA: Diagnosis not present

## 2018-09-15 DIAGNOSIS — I10 Essential (primary) hypertension: Secondary | ICD-10-CM | POA: Diagnosis not present

## 2018-09-15 DIAGNOSIS — Z79899 Other long term (current) drug therapy: Secondary | ICD-10-CM | POA: Diagnosis not present

## 2018-09-15 DIAGNOSIS — Z1339 Encounter for screening examination for other mental health and behavioral disorders: Secondary | ICD-10-CM | POA: Diagnosis not present

## 2018-09-15 DIAGNOSIS — Z1211 Encounter for screening for malignant neoplasm of colon: Secondary | ICD-10-CM | POA: Diagnosis not present

## 2018-09-15 DIAGNOSIS — L309 Dermatitis, unspecified: Secondary | ICD-10-CM | POA: Diagnosis not present

## 2018-09-15 DIAGNOSIS — R5383 Other fatigue: Secondary | ICD-10-CM | POA: Diagnosis not present

## 2018-09-15 DIAGNOSIS — Z1331 Encounter for screening for depression: Secondary | ICD-10-CM | POA: Diagnosis not present

## 2018-09-15 DIAGNOSIS — Z125 Encounter for screening for malignant neoplasm of prostate: Secondary | ICD-10-CM | POA: Diagnosis not present

## 2018-09-15 DIAGNOSIS — Z299 Encounter for prophylactic measures, unspecified: Secondary | ICD-10-CM | POA: Diagnosis not present

## 2018-09-15 DIAGNOSIS — Z0000 Encounter for general adult medical examination without abnormal findings: Secondary | ICD-10-CM | POA: Diagnosis not present

## 2018-09-15 DIAGNOSIS — E7800 Pure hypercholesterolemia, unspecified: Secondary | ICD-10-CM | POA: Diagnosis not present

## 2018-09-15 DIAGNOSIS — Z6831 Body mass index (BMI) 31.0-31.9, adult: Secondary | ICD-10-CM | POA: Diagnosis not present

## 2018-09-15 DIAGNOSIS — Z7189 Other specified counseling: Secondary | ICD-10-CM | POA: Diagnosis not present

## 2018-09-15 LAB — CBC AND DIFFERENTIAL
HCT: 42 (ref 41–53)
Hemoglobin: 13.9 (ref 13.5–17.5)
Neutrophils Absolute: 4
WBC: 7.8

## 2018-09-15 LAB — LIPID PANEL
Cholesterol: 189 (ref 0–200)
HDL: 22 — AB (ref 35–70)
LDL Cholesterol: 123
Triglycerides: 222 — AB (ref 40–160)

## 2018-09-15 LAB — BASIC METABOLIC PANEL
BUN: 25 — AB (ref 4–21)
Creatinine: 1.4 — AB (ref <=1.3)
Glucose: 93
Potassium: 40 — AB (ref 3.4–5.3)
Sodium: 141 (ref 137–147)

## 2018-09-15 LAB — PSA: PSA: 2.2

## 2018-12-07 DIAGNOSIS — E669 Obesity, unspecified: Secondary | ICD-10-CM | POA: Diagnosis not present

## 2018-12-07 DIAGNOSIS — R110 Nausea: Secondary | ICD-10-CM | POA: Diagnosis not present

## 2018-12-07 DIAGNOSIS — K2270 Barrett's esophagus without dysplasia: Secondary | ICD-10-CM | POA: Diagnosis not present

## 2018-12-07 DIAGNOSIS — K219 Gastro-esophageal reflux disease without esophagitis: Secondary | ICD-10-CM | POA: Diagnosis not present

## 2018-12-23 ENCOUNTER — Other Ambulatory Visit: Payer: Self-pay

## 2018-12-23 ENCOUNTER — Ambulatory Visit (INDEPENDENT_AMBULATORY_CARE_PROVIDER_SITE_OTHER): Payer: Medicare Other | Admitting: Family Medicine

## 2018-12-23 ENCOUNTER — Encounter: Payer: Self-pay | Admitting: Family Medicine

## 2018-12-23 VITALS — BP 144/90 | HR 63 | Temp 98.2°F | Ht 71.0 in | Wt 230.8 lb

## 2018-12-23 DIAGNOSIS — L989 Disorder of the skin and subcutaneous tissue, unspecified: Secondary | ICD-10-CM

## 2018-12-23 DIAGNOSIS — M25512 Pain in left shoulder: Secondary | ICD-10-CM | POA: Diagnosis not present

## 2018-12-23 DIAGNOSIS — N289 Disorder of kidney and ureter, unspecified: Secondary | ICD-10-CM

## 2018-12-23 DIAGNOSIS — I10 Essential (primary) hypertension: Secondary | ICD-10-CM

## 2018-12-23 DIAGNOSIS — G8929 Other chronic pain: Secondary | ICD-10-CM | POA: Diagnosis not present

## 2018-12-23 DIAGNOSIS — M130 Polyarthritis, unspecified: Secondary | ICD-10-CM

## 2018-12-23 LAB — POCT URINALYSIS DIP (CLINITEK)
Bilirubin, UA: NEGATIVE
Blood, UA: NEGATIVE
Glucose, UA: NEGATIVE mg/dL
Ketones, POC UA: NEGATIVE mg/dL
Leukocytes, UA: NEGATIVE
Nitrite, UA: NEGATIVE
POC PROTEIN,UA: NEGATIVE
Spec Grav, UA: >=1.03 — AB (ref 1.010–1.025)
Urobilinogen, UA: 0.2 E.U./dL
pH, UA: 5 (ref 5.0–8.0)

## 2018-12-23 NOTE — Patient Instructions (Addendum)
Recommend Omega 3   1-2 grams/day-or ear Salmon or Tuna(not fried) 1-2 serving /week  Drink plenty of water-avoid soda  labwork -[facility]  Referral to [employer] referral

## 2018-12-23 NOTE — Progress Notes (Signed)
New Patient Office Visit  Subjective:  Patient ID: Matthew Cohen, male    DOB: 1951/02/12  Age: 68 y.o. MRN: [id number]  CC:  Chief Complaint  Patient presents with  . New Patient (Initial Visit)  . Medication Management    concerns    HPI Matthew Cohen presents for HTN-metoprolol/chlorthalidone/                                                Arthritis-was told to discontinue diclofenac due to renal function abnormalities-pt states he can not move if he does not take medication-pt would like advanced testing and possible rheumatologist  Dr. Florestine Avers Renal function abnormal. Likely due to chronic use of NSAIDs Arthritis-left shoulder-pain, no injection, no h/o ortho evaluation  Past Medical History:  Diagnosis Date  . Anxiety   . Chronic back pain   . Essential hypertension, benign   . GERD (gastroesophageal reflux disease)   . Mixed dyslipidemia    Statin intolerance    Past Surgical History:  Procedure Laterality Date  . LEFT HEART CATHETERIZATION WITH CORONARY ANGIOGRAM N/A 01/06/2014   Procedure: LEFT HEART CATHETERIZATION WITH CORONARY ANGIOGRAM;  Surgeon: Jettie Booze, MD;  Location: [facility] CATH LAB;  Service: Cardiovascular;  Laterality: N/A;    Family History  Problem Relation Age of Onset  . Coronary artery disease Father        CABG at age 47  . Diabetes Father   . Heart disease Father   . Dementia Mother     Social History   Socioeconomic History  . Marital status: Significant Other    Spouse name: Not on file  . Number of children: Not on file  . Years of education: Not on file  . Highest education level: Not on file  Occupational History  . Occupation: classic cars    Comment: hobby  Social Needs  . Financial resource strain: Not on file  . Food insecurity    Worry: Not on file    Inability: Not on file  . Transportation needs    Medical: Not on file    Non-medical: Not on file  Tobacco Use  . Smoking status: Never  Smoker  . Smokeless tobacco: Never Used  Substance and Sexual Activity  . Alcohol use: No    Comment: "Not enough to mention"  . Drug use: No  . Sexual activity: Yes  Lifestyle  . Physical activity    Days per week: Not on file    Minutes per session: Not on file  . Stress: Not on file  Relationships  . Social Herbalist on phone: Not on file    Gets together: Not on file    Attends religious service: Not on file    Active member of club or organization: Not on file    Attends meetings of clubs or organizations: Not on file    Relationship status: Not on file  . Intimate partner violence    Fear of current or ex partner: Not on file    Emotionally abused: Not on file    Physically abused: Not on file    Forced sexual activity: Not on file  Other Topics Concern  . Not on file  Social History Narrative  . Not on file    ROS Review of Systems  Constitutional: Negative.  Negative  for fatigue and fever.  HENT: Negative.   Eyes: Negative.   Respiratory: Negative.   Cardiovascular: Negative.   Gastrointestinal:       Sees GI -GERD  Genitourinary: Negative.        Renal function concerns-no nephro eval  Musculoskeletal: Positive for arthralgias.       Joint pain-shoulder  Skin:       Lesion -face  Neurological: Negative.   Hematological: Negative.   Psychiatric/Behavioral:       Anxiety in the past    Objective:   Today's Vitals: BP (!) 144/90 (BP Location: Left Arm, Patient Position: Sitting, Cuff Size: Normal)   Pulse 63   Temp 98.2 F (36.8 C) (Oral)   Ht '5\' 11"'$  (1.803 m)   Wt 230 lb 12.8 oz (104.7 kg)   SpO2 95%   BMI 32.19 kg/m   Physical Exam  Assessment & Plan:  1. Essential hypertension, benign  - Comprehensive Metabolic Panel (CMET) - ANA Screen,IFA,Reflex Titer/Pattern,Reflex Mplx 11 Ab Cascade with IdentRA - Rheumatoid Factor - Sed Rate (ESR) - Urine Microscopic  2. Abnormal renal function  - Comprehensive Metabolic Panel  (CMET) - ANA Screen,IFA,Reflex Titer/Pattern,Reflex Mplx 11 Ab Cascade with IdentRA - Rheumatoid Factor - Sed Rate (ESR) - Urine Microscopic - POCT URINALYSIS DIP (CLINITEK)  3. Polyarthritis  - Comprehensive Metabolic Panel (CMET) - ANA Screen,IFA,Reflex Titer/Pattern,Reflex Mplx 11 Ab Cascade with IdentRA - Rheumatoid Factor - Sed Rate (ESR)  4. Lesion of skin of face - Ambulatory referral to Dermatology  5. Chronic left shoulder pain Pt states NO acute injury - Ambulatory referral to Orthopedic Surgery Outpatient Encounter Medications as of 12/23/2018  Medication Sig  . chlorthalidone (HYGROTON) 25 MG tablet Take 25 mg by mouth daily. Half a tablet daily  . diclofenac (VOLTAREN) 75 MG EC tablet Take 75 mg by mouth 2 (two) times daily.  . metoprolol succinate (TOPROL-XL) 25 MG 24 hr tablet Take 25 mg by mouth daily.  Marland Kitchen omeprazole (PRILOSEC) 40 MG capsule Take 40 mg by mouth daily.  Marland Kitchen aspirin EC 81 MG tablet Take 81 mg by mouth daily.   No facility-administered encounter medications on file as of 12/23/2018.     Follow-up: ortho, derm, labwork  Matthew Wisher Hannah Beat, MD

## 2018-12-24 DIAGNOSIS — N289 Disorder of kidney and ureter, unspecified: Secondary | ICD-10-CM | POA: Insufficient documentation

## 2018-12-24 DIAGNOSIS — G8929 Other chronic pain: Secondary | ICD-10-CM | POA: Insufficient documentation

## 2018-12-24 DIAGNOSIS — M130 Polyarthritis, unspecified: Secondary | ICD-10-CM | POA: Insufficient documentation

## 2018-12-24 DIAGNOSIS — L989 Disorder of the skin and subcutaneous tissue, unspecified: Secondary | ICD-10-CM | POA: Insufficient documentation

## 2018-12-28 DIAGNOSIS — M130 Polyarthritis, unspecified: Secondary | ICD-10-CM | POA: Diagnosis not present

## 2018-12-28 DIAGNOSIS — I10 Essential (primary) hypertension: Secondary | ICD-10-CM | POA: Diagnosis not present

## 2018-12-28 DIAGNOSIS — N289 Disorder of kidney and ureter, unspecified: Secondary | ICD-10-CM | POA: Diagnosis not present

## 2019-01-01 LAB — COMPREHENSIVE METABOLIC PANEL
AG Ratio: 1.8 (calc) (ref 1.0–2.5)
ALT: 29 U/L (ref 9–46)
AST: 21 U/L (ref 10–35)
Albumin: 4.6 g/dL (ref 3.6–5.1)
Alkaline phosphatase (APISO): 62 U/L (ref 35–144)
BUN/Creatinine Ratio: 18 (calc) (ref 6–22)
BUN: 26 mg/dL — ABNORMAL HIGH (ref 7–25)
CO2: 31 mmol/L (ref 20–32)
Calcium: 9.6 mg/dL (ref 8.6–10.3)
Chloride: 100 mmol/L (ref 98–110)
Creat: 1.42 mg/dL — ABNORMAL HIGH (ref 0.70–1.25)
Globulin: 2.5 g/dL (calc) (ref 1.9–3.7)
Glucose, Bld: 96 mg/dL (ref 65–99)
Potassium: 4.5 mmol/L (ref 3.5–5.3)
Sodium: 140 mmol/L (ref 135–146)
Total Bilirubin: 0.7 mg/dL (ref 0.2–1.2)
Total Protein: 7.1 g/dL (ref 6.1–8.1)

## 2019-01-01 LAB — URINALYSIS, MICROSCOPIC ONLY
Bacteria, UA: NONE SEEN /HPF
Hyaline Cast: NONE SEEN /LPF
RBC / HPF: NONE SEEN /HPF (ref 0–2)
Squamous Epithelial / LPF: NONE SEEN /HPF (ref <=5)
WBC, UA: NONE SEEN /HPF (ref 0–5)

## 2019-01-01 LAB — ANA SCREEN,IFA,REFLEX TITER/PATTERN,REFLEX MPLX 11 AB CASCADE
14-3-3 eta Protein: <0.2 ng/mL (ref <0.2)
Anti Nuclear Antibody (ANA): NEGATIVE
Cyclic Citrullin Peptide Ab: <16 UNITS
Rhuematoid fact SerPl-aCnc: 22 IU/mL — ABNORMAL HIGH (ref <14)

## 2019-01-01 LAB — SEDIMENTATION RATE: Sed Rate: 17 mm/h (ref 0–20)

## 2019-01-03 ENCOUNTER — Other Ambulatory Visit: Payer: Self-pay | Admitting: Family Medicine

## 2019-01-03 DIAGNOSIS — N289 Disorder of kidney and ureter, unspecified: Secondary | ICD-10-CM

## 2019-01-03 DIAGNOSIS — M130 Polyarthritis, unspecified: Secondary | ICD-10-CM

## 2019-01-03 NOTE — Progress Notes (Signed)
Elevated rf and renal function

## 2019-01-04 ENCOUNTER — Ambulatory Visit: Payer: Medicare Other | Admitting: Orthopaedic Surgery

## 2019-01-06 DIAGNOSIS — X32XXXA Exposure to sunlight, initial encounter: Secondary | ICD-10-CM | POA: Diagnosis not present

## 2019-01-06 DIAGNOSIS — D225 Melanocytic nevi of trunk: Secondary | ICD-10-CM | POA: Diagnosis not present

## 2019-01-06 DIAGNOSIS — L820 Inflamed seborrheic keratosis: Secondary | ICD-10-CM | POA: Diagnosis not present

## 2019-01-06 DIAGNOSIS — L570 Actinic keratosis: Secondary | ICD-10-CM | POA: Diagnosis not present

## 2019-01-13 NOTE — Progress Notes (Signed)
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   History of Present Illness: Matthew Cohen is a 68 y.o. male seen in consultation per request of Dr. Collene Mares.  According to patient about 2 years ago he started having increased pain all over his body.  He states at the time he was placed on diclofenac which was very helpful.  Over time he developed elevated creatinine and decided to come off the diclofenac but he had to restart as the pain recurred.  He states he has history of gastroesophageal reflux for many years.  He was seen by Dr. Collene Mares recently and was diagnosed with Barrett's esophagus.  He was advised to come off diclofenac he has been taking Tylenol only.  He complains of discomfort in his left shoulder, his hands, neck, both knees and muscle pain.  He denies any history of joint swelling.  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  Activities of Daily Living:  Patient reports morning stiffness for 24 hours.   Patient Denies nocturnal pain.  Difficulty dressing/grooming: Denies Difficulty climbing stairs: Denies Difficulty getting out of chair: Denies Difficulty using hands for taps, buttons, cutlery, and/or writing: Denies  Review of Systems  Constitutional: Negative for fatigue and night sweats.  HENT: Negative for mouth sores, mouth dryness and nose dryness.   Eyes: Negative for redness, itching and dryness.  Respiratory: Negative for shortness of breath, wheezing and difficulty breathing.   Cardiovascular: Negative for chest pain, palpitations, hypertension, irregular heartbeat and swelling in legs/feet.  Gastrointestinal: Positive for heartburn. Negative for abdominal pain, blood in stool, constipation and diarrhea.  Endocrine:  Negative for increased urination.  Genitourinary: Negative for difficulty urinating and painful urination.  Musculoskeletal: Positive for arthralgias, joint pain and morning stiffness. Negative for joint swelling, myalgias, muscle weakness, muscle tenderness and myalgias.  Skin: Negative for color change, rash, hair loss, nodules/bumps, skin tightness, ulcers and sensitivity to sunlight.  Allergic/Immunologic: Negative for susceptible to infections.  Neurological: Negative for dizziness, fainting, light-headedness, numbness, headaches, memory loss, night sweats and weakness.  Hematological: Negative for bruising/bleeding tendency and swollen glands.  Psychiatric/Behavioral: Negative for depressed mood, confusion and sleep disturbance. The patient is not nervous/anxious.     PMFS History:  Patient Active Problem List   Diagnosis Date Noted  . Abnormal renal function 12/24/2018  . Polyarthritis 12/24/2018  . Lesion of skin of face 12/24/2018  . Chronic left shoulder pain 12/24/2018  . Essential hypertension, benign 01/04/2014  . Mixed hyperlipidemia 01/04/2014  . Precordial pain 01/04/2014  . Abnormal myocardial perfusion study 01/04/2014    Past Medical History:  Diagnosis Date  . Anxiety   . Chronic back pain   . Essential hypertension, benign   . GERD (gastroesophageal reflux disease)   . Mixed dyslipidemia    Statin intolerance    Family History  Problem Relation Age of Onset  . Coronary artery disease Father        CABG at age 93  . Diabetes Father   . Heart disease Father   . Dementia Mother   . Healthy Daughter   .  Healthy Daughter   . Healthy Daughter    Past Surgical History:  Procedure Laterality Date  . LEFT HEART CATHETERIZATION WITH CORONARY ANGIOGRAM N/A 01/06/2014   Procedure: LEFT HEART CATHETERIZATION WITH CORONARY ANGIOGRAM;  Surgeon: Jettie Booze, MD;  Location: [facility] CATH LAB;  Service: Cardiovascular;  Laterality: N/A;   Social History   Social  History Narrative  . Not on file    There is no immunization history on file for this patient.   Objective: Vital Signs: BP 134/83 (BP Location: Right Arm, Patient Position: Sitting, Cuff Size: Normal)   Pulse (!) 54   Resp 15   Ht 5\' 11"  (1.803 m)   Wt 240 lb (108.9 kg)   BMI 33.47 kg/m    Physical Exam Vitals signs and nursing note reviewed.  Constitutional:      Appearance: He is well-developed.  HENT:     Head: Normocephalic and atraumatic.  Eyes:     Conjunctiva/sclera: Conjunctivae normal.     Pupils: Pupils are equal, round, and reactive to light.  Neck:     Musculoskeletal: Normal range of motion and neck supple.  Cardiovascular:     Rate and Rhythm: Normal rate and regular rhythm.     Heart sounds: Normal heart sounds.  Pulmonary:     Effort: Pulmonary effort is normal.     Breath sounds: Normal breath sounds.  Abdominal:     General: Bowel sounds are normal.     Palpations: Abdomen is soft.  Skin:    General: Skin is warm and dry.     Capillary Refill: Capillary refill takes less than 2 seconds.  Neurological:     Mental Status: He is alert and oriented to person, place, and time.  Psychiatric:        Behavior: Behavior normal.      Musculoskeletal Exam: He has limited painful range of motion of his cervical spine.  He has limited painful range of motion of his left shoulder.  Elbow joints, wrist joints and MCPs were in good range of motion.  He has DIP and PIP thickening bilaterally with no synovitis.  No nail changes were noted.  Hip joints were in good range of motion.  He had painful range of motion of bilateral knee joints without any warmth swelling or effusion.  No changes over ankle joints or MTPs were noted.  CDAI Exam: CDAI Score: - Patient Global: -; Provider Global: - Swollen: -; Tender: - Joint Exam   No joint exam has been documented for this visit   There is currently no information documented on the homunculus. Go to the Rheumatology  activity and complete the homunculus joint exam.  Investigation: Findings:  12/28/18: ANA-, RF 22, CCP<16, 14-3-3 eta <0.2, sed rate 17  Component     Latest Ref Rng & Units 12/28/2018  Anti Nuclear Antibody (ANA)     NEGATIVE NEGATIVE  RA Latex Turbid.     <14 IU/mL 22 (H)  Cyclic Citrullin Peptide Ab     UNITS <16  14-3-3 eta Protein     <0.2 ng/mL <0.2  Sed Rate     0 - 20 mm/h 17   Imaging: Xr Cervical Spine 2 Or 3 Views  Result Date: 01/26/2019 Loss of lumbar lordosis was noted.  Multilevel spondylosis was noted.  Significant narrowing was noted between C5-C6 and C6-C7.  Anterior spurring was noted. Impression: These findings are consistent with multilevel spondylosis of cervical spine.  Xr Hand 2 View Left  Result  Date: 01/26/2019 Severe CMC narrowing, PIP and DIP narrowing was noted.  No MCP, intercarpal radiocarpal joint space narrowing was noted.  No erosive changes were noted. Impression: These findings are consistent with osteoarthritis of the hand.  Xr Hand 2 View Right  Result Date: 01/26/2019 CMC, PIP and DIP narrowing was noted.  No MCP, intercarpal radiocarpal joint space narrowing was noted.  No erosive changes were noted. Impression: These findings are consistent with osteoarthritis of the hand.  Xr Knee 3 View Left  Result Date: 01/26/2019 Moderate to severe medial compartment narrowing was noted.  Severe patellofemoral narrowing was noted.  No chondrocalcinosis was noted. Impression: These findings are consistent moderate osteoarthritis and severe chondromalacia patella.  Xr Knee 3 View Right  Result Date: 01/26/2019 Moderate to severe medial compartment narrowing was noted.  Severe patellofemoral narrowing was noted.  No chondrocalcinosis was noted. 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  Xr Shoulder Left  Result Date: 01/26/2019 Glenohumeral joint space narrowing with  sclerosis and cystic changes was noted.  Inferior spurring was noted.  Acromioclavicular joint narrowing was noted. 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   Recent Labs: Lab Results  Component Value Date   WBC 7.8 09/15/2018   HGB 13.9 09/15/2018   NA 140 12/28/2018   K 4.5 12/28/2018   CL 100 12/28/2018   CO2 31 12/28/2018   GLUCOSE 96 12/28/2018   BUN 26 (H) 12/28/2018   CREATININE 1.42 (H) 12/28/2018   BILITOT 0.7 12/28/2018   AST 21 12/28/2018   ALT 29 12/28/2018   PROT 7.1 12/28/2018   CALCIUM 9.6 12/28/2018    Speciality Comments: No specialty comments available.  Procedures:  No procedures performed Allergies: Patient has no known allergies.   Assessment / Plan:     Visit Diagnoses: Pain in neck -patient complains of chronic discomfort in his cervical spine.  He has limited range of motion.  Plan: XR Cervical Spine 2 or 3 views  Chronic left shoulder pain -he has discomfort with range of motion of his left shoulder joint.  Plan: XR Shoulder Left.  The x-ray showed osteoarthritis of the shoulder joint.  Natural anti-inflammatories were discussed.  We also discussed possible use of Cymbalta to manage pain.  He states he will think more about it and will let us know at the follow-up visit.  Pain in both hands -he has DIP and PIP thickening but no synovitis was noted.  Plan: XR Hand 2 View Right, XR Hand 2 View Left.  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  Chronic pain of both knees -he complains of chronic discomfort in his knee joints.  Plan: XR KNEE 3 VIEW RIGHT, XR KNEE 3 VIEW LEFT.  He has moderate to severe medial compartment narrowing and severe chondromalacia patella.  I  Rheumatoid factor positive-he has low titer positive rheumatoid factor.  Anti-CCP and 14 3 3  eta was negative.  ANA was negative.  Sed rate is normal.  NSAID long-term use -he has discontinued anti-inflammatories due to elevated  creatinine.  He also developed Barrett's esophagus.  He is taking Tylenol currently which is not controlling his pain symptoms.  History of Barrett's esophagus-followed by Dr. Collene Mares.  Abnormal renal function he had elevated creatinine.  Essential hypertension, benign-his blood pressure is mildly elevated.  Lesion of skin of face  Mixed hyperlipidemia  Orders: Orders Placed This Encounter  Procedures  . XR Cervical Spine 2 or  3 views  . XR Shoulder Left  . XR KNEE 3 VIEW RIGHT  . XR KNEE 3 VIEW LEFT  . XR Hand 2 View Right  . XR Hand 2 View Left   No orders of the defined types were placed in this encounter.   Face-to-face time spent with patient was 45 minutes. Greater than 50% of time was spent in counseling and coordination of care.  Follow-Up Instructions: Return for Osteoarthritis, positive rheumatoid factor.   Bo Merino, MD  Note - This record has been created using Editor, commissioning.  Chart creation errors have been sought, but may not always  have been located. Such creation errors do not reflect on  the standard of medical care.

## 2019-01-17 DIAGNOSIS — K317 Polyp of stomach and duodenum: Secondary | ICD-10-CM | POA: Diagnosis not present

## 2019-01-17 DIAGNOSIS — K2100 Gastro-esophageal reflux disease with esophagitis, without bleeding: Secondary | ICD-10-CM | POA: Diagnosis not present

## 2019-01-17 DIAGNOSIS — K2270 Barrett's esophagus without dysplasia: Secondary | ICD-10-CM | POA: Diagnosis not present

## 2019-01-17 DIAGNOSIS — K219 Gastro-esophageal reflux disease without esophagitis: Secondary | ICD-10-CM | POA: Diagnosis not present

## 2019-01-18 ENCOUNTER — Telehealth: Payer: Self-pay | Admitting: Family Medicine

## 2019-01-18 NOTE — Telephone Encounter (Signed)
Routing to Dr. Corum for Advice? 

## 2019-01-18 NOTE — Telephone Encounter (Signed)
Likely all NSAIDs included in recommendation so tylenol only option for pain. Max dose tylenol 500mg  6/day.  Would recommend tylenol not be used with alcohol.

## 2019-01-18 NOTE — Telephone Encounter (Signed)
Matthew Cohen is calling in regards to this patient and states he had a endoscopy done yesterday with Dr. Man and he informed him not to take diclofenac (VOLTAREN) 75 MG EC tablet for 2 weeks. She is wanting to know what else he can take that will be more effective than tylenol. [phone]

## 2019-01-18 NOTE — Telephone Encounter (Signed)
Patient is aware of recommendation 

## 2019-01-19 ENCOUNTER — Other Ambulatory Visit: Payer: Self-pay | Admitting: Family Medicine

## 2019-01-19 ENCOUNTER — Telehealth: Payer: Self-pay

## 2019-01-19 MED ORDER — DICLOFENAC SODIUM 1 % TD GEL: 4.0000 g | Freq: Four times a day (QID) | TRANSDERMAL | 0 refills | Status: DC

## 2019-01-19 NOTE — Telephone Encounter (Signed)
Topical anti-inflammatory medication-voltan, heat, stretches.  Keep appt for rheumatology evaluation-call daily for cancellation to be sooner.

## 2019-01-19 NOTE — Telephone Encounter (Signed)
Matthew Cohen is stating that he is aching so bad he states that the Tylenol is not helping at all he feels like he is going to be bed ridden from aching & pain, please advise?

## 2019-01-19 NOTE — Telephone Encounter (Signed)
Patient is aware of the recommendation 

## 2019-01-26 ENCOUNTER — Other Ambulatory Visit: Payer: Self-pay

## 2019-01-26 ENCOUNTER — Ambulatory Visit: Payer: Self-pay

## 2019-01-26 ENCOUNTER — Ambulatory Visit (INDEPENDENT_AMBULATORY_CARE_PROVIDER_SITE_OTHER): Payer: Medicare Other

## 2019-01-26 ENCOUNTER — Ambulatory Visit (INDEPENDENT_AMBULATORY_CARE_PROVIDER_SITE_OTHER): Payer: Medicare Other | Admitting: Rheumatology

## 2019-01-26 ENCOUNTER — Encounter: Payer: Self-pay | Admitting: Rheumatology

## 2019-01-26 ENCOUNTER — Encounter (INDEPENDENT_AMBULATORY_CARE_PROVIDER_SITE_OTHER): Payer: Self-pay

## 2019-01-26 VITALS — BP 134/83 | HR 54 | Resp 15 | Ht 71.0 in | Wt 240.0 lb

## 2019-01-26 DIAGNOSIS — Z791 Long term (current) use of non-steroidal anti-inflammatories (NSAID): Secondary | ICD-10-CM | POA: Diagnosis not present

## 2019-01-26 DIAGNOSIS — M25542 Pain in joints of left hand: Secondary | ICD-10-CM

## 2019-01-26 DIAGNOSIS — M542 Cervicalgia: Secondary | ICD-10-CM

## 2019-01-26 DIAGNOSIS — M25562 Pain in left knee: Secondary | ICD-10-CM

## 2019-01-26 DIAGNOSIS — M79641 Pain in right hand: Secondary | ICD-10-CM | POA: Diagnosis not present

## 2019-01-26 DIAGNOSIS — L989 Disorder of the skin and subcutaneous tissue, unspecified: Secondary | ICD-10-CM

## 2019-01-26 DIAGNOSIS — I10 Essential (primary) hypertension: Secondary | ICD-10-CM | POA: Diagnosis not present

## 2019-01-26 DIAGNOSIS — R768 Other specified abnormal immunological findings in serum: Secondary | ICD-10-CM | POA: Diagnosis not present

## 2019-01-26 DIAGNOSIS — G8929 Other chronic pain: Secondary | ICD-10-CM

## 2019-01-26 DIAGNOSIS — E782 Mixed hyperlipidemia: Secondary | ICD-10-CM

## 2019-01-26 DIAGNOSIS — Z8719 Personal history of other diseases of the digestive system: Secondary | ICD-10-CM | POA: Diagnosis not present

## 2019-01-26 DIAGNOSIS — M25511 Pain in right shoulder: Secondary | ICD-10-CM

## 2019-01-26 DIAGNOSIS — M25541 Pain in joints of right hand: Secondary | ICD-10-CM

## 2019-01-26 DIAGNOSIS — M25561 Pain in right knee: Secondary | ICD-10-CM

## 2019-01-26 DIAGNOSIS — M25512 Pain in left shoulder: Secondary | ICD-10-CM | POA: Diagnosis not present

## 2019-01-26 DIAGNOSIS — N289 Disorder of kidney and ureter, unspecified: Secondary | ICD-10-CM | POA: Diagnosis not present

## 2019-01-26 DIAGNOSIS — M79642 Pain in left hand: Secondary | ICD-10-CM

## 2019-01-26 NOTE — Patient Instructions (Signed)
Shoulder Exercises Ask your health care provider which exercises are safe for you. Do exercises exactly as told by your health care provider and adjust them as directed. It is normal to feel mild stretching, pulling, tightness, or discomfort as you do these exercises. Stop right away if you feel sudden pain or your pain gets worse. Do not begin these exercises until told by your health care provider. Stretching exercises External rotation and abduction This exercise is sometimes called corner stretch. This exercise rotates your arm outward (external rotation) and moves your arm out from your body (abduction). 1. Stand in a doorway with one of your feet slightly in front of the other. This is called a staggered stance. If you cannot reach your forearms to the door frame, stand facing a corner of a room. 2. Choose one of the following positions as told by your health care provider: ? Place your hands and forearms on the door frame above your head. ? Place your hands and forearms on the door frame at the height of your head. ? Place your hands on the door frame at the height of your elbows. 3. Slowly move your weight onto your front foot until you feel a stretch across your chest and in the front of your shoulders. Keep your head and chest upright and keep your abdominal muscles tight. 4. Hold for __________ seconds. 5. To release the stretch, shift your weight to your back foot. Repeat __________ times. Complete this exercise __________ times a day. Extension, standing 1. Stand and hold a broomstick, a cane, or a similar object behind your back. ? Your hands should be a little wider than shoulder width apart. ? Your palms should face away from your back. 2. Keeping your elbows straight and your shoulder muscles relaxed, move the stick away from your body until you feel a stretch in your shoulders (extension). ? Avoid shrugging your shoulders while you move the stick. Keep your shoulder blades tucked  down toward the middle of your back. 3. Hold for __________ seconds. 4. Slowly return to the starting position. Repeat __________ times. Complete this exercise __________ times a day. Range-of-motion exercises Pendulum  1. Stand near a wall or a surface that you can hold onto for balance. 2. Bend at the waist and let your left / right arm hang straight down. Use your other arm to support you. Keep your back straight and do not lock your knees. 3. Relax your left / right arm and shoulder muscles, and move your hips and your trunk so your left / right arm swings freely. Your arm should swing because of the motion of your body, not because you are using your arm or shoulder muscles. 4. Keep moving your hips and trunk so your arm swings in the following directions, as told by your health care provider: ? Side to side. ? Forward and backward. ? In clockwise and counterclockwise circles. 5. Continue each motion for __________ seconds, or for as long as told by your health care provider. 6. Slowly return to the starting position. Repeat __________ times. Complete this exercise __________ times a day. Shoulder flexion, standing  1. Stand and hold a broomstick, a cane, or a similar object. Place your hands a little more than shoulder width apart on the object. Your left / right hand should be palm up, and your other hand should be palm down. 2. Keep your elbow straight and your shoulder muscles relaxed. Push the stick up with your healthy arm to  raise your left / right arm in front of your body, and then over your head until you feel a stretch in your shoulder (flexion). ? Avoid shrugging your shoulder while you raise your arm. Keep your shoulder blade tucked down toward the middle of your back. 3. Hold for __________ seconds. 4. Slowly return to the starting position. Repeat __________ times. Complete this exercise __________ times a day. Shoulder abduction, standing 1. Stand and hold a broomstick,  a cane, or a similar object. Place your hands a little more than shoulder width apart on the object. Your left / right hand should be palm up, and your other hand should be palm down. 2. Keep your elbow straight and your shoulder muscles relaxed. Push the object across your body toward your left / right side. Raise your left / right arm to the side of your body (abduction) until you feel a stretch in your shoulder. ? Do not raise your arm above shoulder height unless your health care provider tells you to do that. ? If directed, raise your arm over your head. ? Avoid shrugging your shoulder while you raise your arm. Keep your shoulder blade tucked down toward the middle of your back. 3. Hold for __________ seconds. 4. Slowly return to the starting position. Repeat __________ times. Complete this exercise __________ times a day. Internal rotation  1. Place your left / right hand behind your back, palm up. 2. Use your other hand to dangle an exercise band, a towel, or a similar object over your shoulder. Grasp the band with your left / right hand so you are holding on to both ends. 3. Gently pull up on the band until you feel a stretch in the front of your left / right shoulder. The movement of your arm toward the center of your body is called internal rotation. ? Avoid shrugging your shoulder while you raise your arm. Keep your shoulder blade tucked down toward the middle of your back. 4. Hold for __________ seconds. 5. Release the stretch by letting go of the band and lowering your hands. Repeat __________ times. Complete this exercise __________ times a day. Strengthening exercises External rotation  1. Sit in a stable chair without armrests. 2. Secure an exercise band to a stable object at elbow height on your left / right side. 3. Place a soft object, such as a folded towel or a small pillow, between your left / right upper arm and your body to move your elbow about 4 inches (10 cm) away  from your side. 4. Hold the end of the exercise band so it is tight and there is no slack. 5. Keeping your elbow pressed against the soft object, slowly move your forearm out, away from your abdomen (external rotation). Keep your body steady so only your forearm moves. 6. Hold for __________ seconds. 7. Slowly return to the starting position. Repeat __________ times. Complete this exercise __________ times a day. Shoulder abduction  1. Sit in a stable chair without armrests, or stand up. 2. Hold a __________ weight in your left / right hand, or hold an exercise band with both hands. 3. Start with your arms straight down and your left / right palm facing in, toward your body. 4. Slowly lift your left / right hand out to your side (abduction). Do not lift your hand above shoulder height unless your health care provider tells you that this is safe. ? Keep your arms straight. ? Avoid shrugging your shoulder while you  do this movement. Keep your shoulder blade tucked down toward the middle of your back. 5. Hold for __________ seconds. 6. Slowly lower your arm, and return to the starting position. Repeat __________ times. Complete this exercise __________ times a day. Shoulder extension 1. Sit in a stable chair without armrests, or stand up. 2. Secure an exercise band to a stable object in front of you so it is at shoulder height. 3. Hold one end of the exercise band in each hand. Your palms should face each other. 4. Straighten your elbows and lift your hands up to shoulder height. 5. Step back, away from the secured end of the exercise band, until the band is tight and there is no slack. 6. Squeeze your shoulder blades together as you pull your hands down to the sides of your thighs (extension). Stop when your hands are straight down by your sides. Do not let your hands go behind your body. 7. Hold for __________ seconds. 8. Slowly return to the starting position. Repeat __________ times.  Complete this exercise __________ times a day. Shoulder row 1. Sit in a stable chair without armrests, or stand up. 2. Secure an exercise band to a stable object in front of you so it is at waist height. 3. Hold one end of the exercise band in each hand. Position your palms so that your thumbs are facing the ceiling (neutral position). 4. Bend each of your elbows to a 90-degree angle (right angle) and keep your upper arms at your sides. 5. Step back until the band is tight and there is no slack. 6. Slowly pull your elbows back behind you. 7. Hold for __________ seconds. 8. Slowly return to the starting position. Repeat __________ times. Complete this exercise __________ times a day. Shoulder press-ups  1. Sit in a stable chair that has armrests. Sit upright, with your feet flat on the floor. 2. Put your hands on the armrests so your elbows are bent and your fingers are pointing forward. Your hands should be about even with the sides of your body. 3. Push down on the armrests and use your arms to lift yourself off the chair. Straighten your elbows and lift yourself up as much as you comfortably can. ? Move your shoulder blades down, and avoid letting your shoulders move up toward your ears. ? Keep your feet on the ground. As you get stronger, your feet should support less of your body weight as you lift yourself up. 4. Hold for __________ seconds. 5. Slowly lower yourself back into the chair. Repeat __________ times. Complete this exercise __________ times a day. Wall push-ups  1. Stand so you are facing a stable wall. Your feet should be about one arm-length away from the wall. 2. Lean forward and place your palms on the wall at shoulder height. 3. Keep your feet flat on the floor as you bend your elbows and lean forward toward the wall. 4. Hold for __________ seconds. 5. Straighten your elbows to push yourself back to the starting position. Repeat __________ times. Complete this exercise  __________ times a day. This information is not intended to replace advice given to you by your health care provider. Make sure you discuss any questions you have with your health care provider. Document Released: 02/12/2005 Document Revised: 07/23/2018 Document Reviewed: 04/30/2018 Elsevier Patient Education  2020 [employer]. Cervical Strain and Sprain Rehab Ask your health care provider which exercises are safe for you. Do exercises exactly as told by your health  care provider and adjust them as directed. It is normal to feel mild stretching, pulling, tightness, or discomfort as you do these exercises. Stop right away if you feel sudden pain or your pain gets worse. Do not begin these exercises until told by your health care provider. Stretching and range-of-motion exercises Cervical side bending  1. Using good posture, sit on a stable chair or stand up. 2. Without moving your shoulders, slowly tilt your left / right ear to your shoulder until you feel a stretch in the opposite side neck muscles. You should be looking straight ahead. 3. Hold for __________ seconds. 4. Repeat with the other side of your neck. Repeat __________ times. Complete this exercise __________ times a day. Cervical rotation  1. Using good posture, sit on a stable chair or stand up. 2. Slowly turn your head to the side as if you are looking over your left / right shoulder. ? Keep your eyes level with the ground. ? Stop when you feel a stretch along the side and the back of your neck. 3. Hold for __________ seconds. 4. Repeat this by turning to your other side. Repeat __________ times. Complete this exercise __________ times a day. Thoracic extension and pectoral stretch 1. Roll a towel or a small blanket so it is about 4 inches (10 cm) in diameter. 2. Lie down on your back on a firm surface. 3. Put the towel lengthwise, under your spine in the middle of your back. It should not be under your shoulder blades. The  towel should line up with your spine from your middle back to your lower back. 4. Put your hands behind your head and let your elbows fall out to your sides. 5. Hold for __________ seconds. Repeat __________ times. Complete this exercise __________ times a day. Strengthening exercises Isometric upper cervical flexion 1. Lie on your back with a thin pillow behind your head and a small rolled-up towel under your neck. 2. Gently tuck your chin toward your chest and nod your head down to look toward your feet. Do not lift your head off the pillow. 3. Hold for __________ seconds. 4. Release the tension slowly. Relax your neck muscles completely before you repeat this exercise. Repeat __________ times. Complete this exercise __________ times a day. Isometric cervical extension  1. Stand about 6 inches (15 cm) away from a wall, with your back facing the wall. 2. Place a soft object, about 6-8 inches (15-20 cm) in diameter, between the back of your head and the wall. A soft object could be a small pillow, a ball, or a folded towel. 3. Gently tilt your head back and press into the soft object. Keep your jaw and forehead relaxed. 4. Hold for __________ seconds. 5. Release the tension slowly. Relax your neck muscles completely before you repeat this exercise. Repeat __________ times. Complete this exercise __________ times a day. Posture and body mechanics Body mechanics refers to the movements and positions of your body while you do your daily activities. Posture is part of body mechanics. Good posture and healthy body mechanics can help to relieve stress in your body's tissues and joints. Good posture means that your spine is in its natural S-curve position (your spine is neutral), your shoulders are pulled back slightly, and your head is not tipped forward. The following are general guidelines for applying improved posture and body mechanics to your everyday activities. Sitting  1. When sitting, keep  your spine neutral and keep your feet flat on  the floor. Use a footrest, if necessary, and keep your thighs parallel to the floor. Avoid rounding your shoulders, and avoid tilting your head forward. 2. When working at a desk or a computer, keep your desk at a height where your hands are slightly lower than your elbows. Slide your chair under your desk so you are close enough to maintain good posture. 3. When working at a computer, place your monitor at a height where you are looking straight ahead and you do not have to tilt your head forward or downward to look at the screen. Standing   When standing, keep your spine neutral and keep your feet about hip-width apart. Keep a slight bend in your knees. Your ears, shoulders, and hips should line up.  When you do a task in which you stand in one place for a long time, place one foot up on a stable object that is 2-4 inches (5-10 cm) high, such as a footstool. This helps keep your spine neutral. Resting When lying down and resting, avoid positions that are most painful for you. Try to support your neck in a neutral position. You can use a contour pillow or a small rolled-up towel. Your pillow should support your neck but not push on it. This information is not intended to replace advice given to you by your health care provider. Make sure you discuss any questions you have with your health care provider. Document Released: 03/31/2005 Document Revised: 07/21/2018 Document Reviewed: 12/30/2017 Elsevier Patient Education  2020 [employer].

## 2019-01-26 NOTE — Progress Notes (Signed)
Pharmacy Note  Subjective:  Patient presents today to the [facility] to see Dr. Estanislado Pandy.   Patient seen by the pharmacist for counseling on natural anti-inflammatories and Cymbalta for osteoarthritis.  Objective: Current Outpatient Medications on File Prior to Visit  Medication Sig Dispense Refill  . Acetaminophen (TYLENOL PO) Take 2 tablets by mouth 2 (two) times daily.    . chlorthalidone (HYGROTON) 25 MG tablet Take 12.5 mg by mouth daily. Half a tablet daily     . metoprolol succinate (TOPROL-XL) 25 MG 24 hr tablet Take 25 mg by mouth daily.    Marland Kitchen omeprazole (PRILOSEC) 40 MG capsule Take 40 mg by mouth daily.    Marland Kitchen aspirin EC 81 MG tablet Take 81 mg by mouth daily.    . diclofenac (VOLTAREN) 75 MG EC tablet Take 75 mg by mouth 2 (two) times daily.    . diclofenac sodium (VOLTAREN) 1 % GEL Apply 4 g topically 4 (four) times daily. (Patient not taking: Reported on 01/26/2019) 100 g 0   No current facility-administered medications on file prior to visit.      Assessment/Plan:  Counseled on the purpose, proper use, and adverse effects of natural anti-inflammatories including upset stomach and increased bleeding risk.  Encouraged patient to add one medication at a time and to include on medication list to monitor for adverse effects and drug interactions.  Given educational handout with recommended doses.  Patient was counseled on the purpose, proper use, and adverse effects of duloxetine including nausea, constipation, dizziness, confusion, blurry vision, increased blood pressure, increased sweating, headache, and urinary retention.  Reviewed black boxed warning of increased risk of suicidal thoughts in young adults.  Provided patient with educational materials on duloxetine and answered all questions.    He would like to wait on starting Cymbalta.  We will revisit at his follow up appointment.  All questions encouraged and answered.  Instructed patient to call with any other  questions or concerns.   Mariella Saa, PharmD, [place], [facility] [phone]  01/26/2019 10:37 AM

## 2019-02-03 ENCOUNTER — Telehealth: Payer: Self-pay | Admitting: Family Medicine

## 2019-02-03 NOTE — Telephone Encounter (Signed)
Pt spouse, Carmel [place], is calling and states it has been the 2 week the patient was supposed to stop taking diclofenac She is wanting to know should he go ahead and start Diclofenac again or wait until he sees kidney specialist. She can be reached at [phone]

## 2019-02-04 NOTE — Telephone Encounter (Signed)
Routing to Dr. Corum for advice ? 

## 2019-02-07 NOTE — Telephone Encounter (Signed)
Would not recommend pt start taking diclofenac. Renal specialist do not recommend NSAIDs for persons with kidney abnormalities

## 2019-02-07 NOTE — Telephone Encounter (Signed)
Patients wife Matthew Cohen is aware of the recommendation

## 2019-02-08 DIAGNOSIS — M47816 Spondylosis without myelopathy or radiculopathy, lumbar region: Secondary | ICD-10-CM | POA: Diagnosis not present

## 2019-02-08 DIAGNOSIS — M9902 Segmental and somatic dysfunction of thoracic region: Secondary | ICD-10-CM | POA: Diagnosis not present

## 2019-02-08 DIAGNOSIS — M9901 Segmental and somatic dysfunction of cervical region: Secondary | ICD-10-CM | POA: Diagnosis not present

## 2019-02-08 DIAGNOSIS — M9903 Segmental and somatic dysfunction of lumbar region: Secondary | ICD-10-CM | POA: Diagnosis not present

## 2019-02-08 DIAGNOSIS — M47812 Spondylosis without myelopathy or radiculopathy, cervical region: Secondary | ICD-10-CM | POA: Diagnosis not present

## 2019-02-08 DIAGNOSIS — M4125 Other idiopathic scoliosis, thoracolumbar region: Secondary | ICD-10-CM | POA: Diagnosis not present

## 2019-02-08 NOTE — Progress Notes (Signed)
Office Visit Note  Patient: Matthew Cohen             Date of Birth: [birth year]           MRN: [id number]             PCP: Maryruth Hancock, MD Referring: Maryruth Hancock, MD Visit Date: 02/21/2019 Occupation: @GUAROCC @  Subjective:  Left shoulder joint pain   History of Present Illness: Matthew Cohen is a 68 y.o. male with history of osteoarthritis and DDD. He is having persistent left shoulder joint pain, pain in both hands, and both knee joints. He denies any joint swelling.  He states the pain is most severe in the left shoulder joint.  He denies any injuries. He does not want a cortisone injection due to having an underlying cardiac condition.  He has tried taking diclofenac, but he developed elevated creatinine.  He has been taking tylenol as needed for pain relief.  He has tried shoulder joint strengthening and ROM exercises, which exacerbated the discomfort. He would like to proceed with MRI of the left shoulder joint.    Activities of Daily Living:  Patient reports morning stiffness for 5-10 minutes.   Patient Reports nocturnal pain.  Difficulty dressing/grooming: Reports Difficulty climbing stairs: Denies Difficulty getting out of chair: Denies Difficulty using hands for taps, buttons, cutlery, and/or writing: Denies  Review of Systems  Constitutional: Negative for fatigue and night sweats.  HENT: Negative for mouth sores, mouth dryness and nose dryness.   Eyes: Negative for redness, itching and dryness.  Respiratory: Negative for cough, hemoptysis, shortness of breath, wheezing and difficulty breathing.   Cardiovascular: Negative for chest pain, palpitations, hypertension, irregular heartbeat and swelling in legs/feet.  Gastrointestinal: Negative for abdominal pain, blood in stool, constipation and diarrhea.  Endocrine: Negative for increased urination.  Genitourinary: Negative for difficulty urinating and painful urination.  Musculoskeletal: Positive for arthralgias, joint  pain and morning stiffness. Negative for joint swelling, myalgias, muscle weakness, muscle tenderness and myalgias.  Skin: Negative for color change, rash, hair loss, nodules/bumps, skin tightness, ulcers and sensitivity to sunlight.  Allergic/Immunologic: Negative for susceptible to infections.  Neurological: Positive for memory loss. Negative for dizziness, fainting, light-headedness, numbness, headaches, night sweats and weakness.  Hematological: Negative for swollen glands.  Psychiatric/Behavioral: Negative for depressed mood, confusion and sleep disturbance. The patient is not nervous/anxious.     PMFS History:  Patient Active Problem List   Diagnosis Date Noted  . Abnormal renal function 12/24/2018  . Polyarthritis 12/24/2018  . Lesion of skin of face 12/24/2018  . Chronic left shoulder pain 12/24/2018  . Essential hypertension, benign 01/04/2014  . Mixed hyperlipidemia 01/04/2014  . Precordial pain 01/04/2014  . Abnormal myocardial perfusion study 01/04/2014    Past Medical History:  Diagnosis Date  . Anxiety   . Chronic back pain   . Essential hypertension, benign   . GERD (gastroesophageal reflux disease)   . Mixed dyslipidemia    Statin intolerance    Family History  Problem Relation Age of Onset  . Coronary artery disease Father        CABG at age 62  . Diabetes Father   . Heart disease Father   . Dementia Mother   . Healthy Daughter   . Healthy Daughter   . Healthy Daughter    Past Surgical History:  Procedure Laterality Date  . LEFT HEART CATHETERIZATION WITH CORONARY ANGIOGRAM N/A 01/06/2014   Procedure: LEFT HEART CATHETERIZATION WITH CORONARY ANGIOGRAM;  Surgeon: Jettie Booze, MD;  Location: [facility] CATH LAB;  Service: Cardiovascular;  Laterality: N/A;   Social History   Social History Narrative  . Not on file    There is no immunization history on file for this patient.   Objective: Vital Signs: BP (!) 145/81 (BP Location: Right Arm, Patient  Position: Sitting, Cuff Size: Normal)   Pulse (!) 55   Resp 14   Ht 5\' 11"  (1.803 m)   Wt 228 lb (103.4 kg)   BMI 31.80 kg/m    Physical Exam Vitals signs and nursing note reviewed.  Constitutional:      Appearance: He is well-developed.  HENT:     Head: Normocephalic and atraumatic.  Eyes:     Conjunctiva/sclera: Conjunctivae normal.     Pupils: Pupils are equal, round, and reactive to light.  Neck:     Musculoskeletal: Normal range of motion and neck supple.  Cardiovascular:     Rate and Rhythm: Normal rate and regular rhythm.     Heart sounds: Normal heart sounds.  Pulmonary:     Effort: Pulmonary effort is normal.     Breath sounds: Normal breath sounds.  Abdominal:     General: Bowel sounds are normal.     Palpations: Abdomen is soft.  Skin:    General: Skin is warm and dry.     Capillary Refill: Capillary refill takes less than 2 seconds.  Neurological:     Mental Status: He is alert and oriented to person, place, and time.  Psychiatric:        Behavior: Behavior normal.      Musculoskeletal Exam: Painful ROM of the left shoulder joint.  He has full active and passive ROM of the left knee joint. Right shoulder good ROM with no discomfort.  Elbow joints, wrist joints, MCPs, PIPs, and DIPs good ROM with no synovitis.  PIP and DIP synovial thickening consistent with osteoarthritis of both hands. Complete fist formation bilaterally. Hip joints, knee joints, ankle joints, MTPs, PIPs, and DIPs good ROM with no synovitis.  No warmth or effusion of knee joints.  No tenderness or swelling of ankle joints.    CDAI Exam: CDAI Score: - Patient Global: -; Provider Global: - Swollen: -; Tender: - Joint Exam   No joint exam has been documented for this visit   There is currently no information documented on the homunculus. Go to the Rheumatology activity and complete the homunculus joint exam.  Investigation: No additional findings.  Imaging: Xr Cervical Spine 2 Or 3  Views  Result Date: 01/26/2019 Loss of lumbar lordosis was noted.  Multilevel spondylosis was noted.  Significant narrowing was noted between C5-C6 and C6-C7.  Anterior spurring was noted. Impression: These findings are consistent with multilevel spondylosis of cervical spine.  Xr Hand 2 View Left  Result Date: 01/26/2019 Severe CMC narrowing, PIP and DIP narrowing was noted.  No MCP, intercarpal radiocarpal joint space narrowing was noted.  No erosive changes were noted. Impression: These findings are consistent with osteoarthritis of the hand.  Xr Hand 2 View Right  Result Date: 01/26/2019 CMC, PIP and DIP narrowing was noted.  No MCP, intercarpal radiocarpal joint space narrowing was noted.  No erosive changes were noted. Impression: These findings are consistent with osteoarthritis of the hand.  Xr Knee 3 View Left  Result Date: 01/26/2019 Moderate to severe medial compartment narrowing was noted.  Severe patellofemoral narrowing was noted.  No chondrocalcinosis was noted. Impression: These findings are consistent moderate  osteoarthritis and severe chondromalacia patella.  Xr Knee 3 View Right  Result Date: 01/26/2019 Moderate to severe medial compartment narrowing was noted.  Severe patellofemoral narrowing was noted.  No chondrocalcinosis was noted. 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  Xr Shoulder Left  Result Date: 01/26/2019 Glenohumeral joint space narrowing with sclerosis and cystic changes was noted.  Inferior spurring was noted.  Acromioclavicular joint narrowing was noted. 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   Recent Labs: Lab Results  Component Value Date   WBC 7.8 09/15/2018   HGB 13.9 09/15/2018   NA 140 12/28/2018   K 4.5 12/28/2018   CL 100 12/28/2018   CO2 31 12/28/2018   GLUCOSE 96 12/28/2018   BUN 26 (H) 12/28/2018   CREATININE  1.42 (H) 12/28/2018   BILITOT 0.7 12/28/2018   AST 21 12/28/2018   ALT 29 12/28/2018   PROT 7.1 12/28/2018   CALCIUM 9.6 12/28/2018    Speciality Comments: No specialty comments available.  Procedures:  No procedures performed Allergies: Patient has no known allergies.   Assessment / Plan:     Visit Diagnoses: Primary osteoarthritis of both hands: He has PIP and DIP synovial thickening consistent with osteoarthritis of both hands.  No synovitis or tenderness noted.  He has complete fist formation bilaterally.  Joint protection and muscle strengthening were discussed.  He will follow up in 6 months.   Primary osteoarthritis of both knees - Bilateral moderate osteoarthritis and bilateral severe chondromalacia patella: He has good ROM of bilateral knee joints.  No warmth or effusion of knee joints noted.    Primary osteoarthritis, left shoulder: He has painful ROM of the left shoulder joint.  He has full active and passive ROM with discomfort. He has been experiencing nocturnal pain. He did not have any injuries.  X-rays of the left shoulder were obtained on 01/26/19, which revealed findings consistent with osteoarthritis of the glenohumeral joint space and AC joint space narrowing.  He has tried shoulder joint exercises, which exacerbate his discomfort.  He also tried taking diclofenac tablets and gel for pain relief but discontinued due to elevated creatinine and low GFR.  He has been taking tylenol as needed for pain relief, which has not been very effective.  He declined a cortisone injection due to an underlying cardiac conditions that prevents him from having steroid injections.  He would like to schedule a MRI of the left shoulder joint to further assess. Order will be placed today.   Rheumatoid factor positive - RF low titer, anti-CCP negative, 14 3 3  eta negative, ANA negative: She has no synovitis on exam.   DDD (degenerative disc disease), cervical: He has good ROM with no discomfort.   He has no symptoms of radiculopathy at this time.   NSAID long-term use: He discontinued diclofenac tablets due to elevated creatinine on 12/28/18.   Other medical conditions are listed as follows:   History of Barrett's esophagus  Abnormal renal function  Essential hypertension, benign  Lesion of skin of face  Mixed hyperlipidemia  Orders: No orders of the defined types were placed in this encounter.  No orders of the defined types were placed in this encounter.     Follow-Up Instructions: Return in about 6 months (around 08/21/2019) for Osteoarthritis, DDD.   Ofilia Neas, PA-C   I examined and evaluated the patient with Hazel Sams PA.  Patient has low titer rheumatoid factor.  Although he had no synovitis on examination.  He continues to have pain and discomfort in his left shoulder joint.  He has tried NSAIDs and exercises without results.  He cannot have cortisone injection.  We will schedule MRI of his left shoulder joint to look for internal derangement.  The plan of care was discussed as noted above.  Bo Merino, MD  Note - This record has been created using Editor, commissioning.  Chart creation errors have been sought, but may not always  have been located. Such creation errors do not reflect on  the standard of medical care.

## 2019-02-21 ENCOUNTER — Encounter: Payer: Self-pay | Admitting: Rheumatology

## 2019-02-21 ENCOUNTER — Ambulatory Visit (INDEPENDENT_AMBULATORY_CARE_PROVIDER_SITE_OTHER): Payer: Medicare Other | Admitting: Rheumatology

## 2019-02-21 ENCOUNTER — Other Ambulatory Visit: Payer: Self-pay

## 2019-02-21 VITALS — BP 145/81 | HR 55 | Resp 14 | Ht 71.0 in | Wt 228.0 lb

## 2019-02-21 DIAGNOSIS — E782 Mixed hyperlipidemia: Secondary | ICD-10-CM

## 2019-02-21 DIAGNOSIS — M19041 Primary osteoarthritis, right hand: Secondary | ICD-10-CM

## 2019-02-21 DIAGNOSIS — N289 Disorder of kidney and ureter, unspecified: Secondary | ICD-10-CM

## 2019-02-21 DIAGNOSIS — I10 Essential (primary) hypertension: Secondary | ICD-10-CM

## 2019-02-21 DIAGNOSIS — M5030 Other cervical disc degeneration, unspecified cervical region: Secondary | ICD-10-CM

## 2019-02-21 DIAGNOSIS — M19042 Primary osteoarthritis, left hand: Secondary | ICD-10-CM | POA: Diagnosis not present

## 2019-02-21 DIAGNOSIS — Z791 Long term (current) use of non-steroidal anti-inflammatories (NSAID): Secondary | ICD-10-CM

## 2019-02-21 DIAGNOSIS — Z8719 Personal history of other diseases of the digestive system: Secondary | ICD-10-CM

## 2019-02-21 DIAGNOSIS — M170 Bilateral primary osteoarthritis of knee: Secondary | ICD-10-CM | POA: Diagnosis not present

## 2019-02-21 DIAGNOSIS — R7689 Other specified abnormal immunological findings in serum: Secondary | ICD-10-CM

## 2019-02-21 DIAGNOSIS — M19012 Primary osteoarthritis, left shoulder: Secondary | ICD-10-CM

## 2019-02-21 DIAGNOSIS — R768 Other specified abnormal immunological findings in serum: Secondary | ICD-10-CM

## 2019-02-21 DIAGNOSIS — L989 Disorder of the skin and subcutaneous tissue, unspecified: Secondary | ICD-10-CM | POA: Diagnosis not present

## 2019-02-22 DIAGNOSIS — M47812 Spondylosis without myelopathy or radiculopathy, cervical region: Secondary | ICD-10-CM | POA: Diagnosis not present

## 2019-02-22 DIAGNOSIS — M9901 Segmental and somatic dysfunction of cervical region: Secondary | ICD-10-CM | POA: Diagnosis not present

## 2019-02-22 DIAGNOSIS — M9902 Segmental and somatic dysfunction of thoracic region: Secondary | ICD-10-CM | POA: Diagnosis not present

## 2019-02-22 DIAGNOSIS — M4125 Other idiopathic scoliosis, thoracolumbar region: Secondary | ICD-10-CM | POA: Diagnosis not present

## 2019-02-22 DIAGNOSIS — M47816 Spondylosis without myelopathy or radiculopathy, lumbar region: Secondary | ICD-10-CM | POA: Diagnosis not present

## 2019-02-22 DIAGNOSIS — M9903 Segmental and somatic dysfunction of lumbar region: Secondary | ICD-10-CM | POA: Diagnosis not present

## 2019-03-02 ENCOUNTER — Ambulatory Visit: Payer: Medicare Other | Admitting: Orthopedic Surgery

## 2019-03-07 ENCOUNTER — Telehealth: Payer: Self-pay | Admitting: Family Medicine

## 2019-03-07 NOTE — Telephone Encounter (Signed)
FYI: Advised patients spouse that he needed to be tested for Covid. If he was positive they needed to notify their jobs that he has tested positive and those that have come in close contact with him. They also need to quarantine until they get the results.if he tests negative, he can call back and see if he can make a telephone visit appt or possible an in person visit to be treated for his symptoms. All with verbal understanding.

## 2019-03-07 NOTE — Telephone Encounter (Signed)
Patient spouse Carmel [place] is calling and states the patient has been weak, diarrhea, dry cough, no fever, body aches for 2 weeks on 03/09/19. He was exposed to someone that had covid on 02/21/19. She is unsure what he needs to do, if he needs to get tested or what the doctor recommends.   CB# [phone] Cheri spouse

## 2019-04-29 DIAGNOSIS — M47812 Spondylosis without myelopathy or radiculopathy, cervical region: Secondary | ICD-10-CM | POA: Diagnosis not present

## 2019-04-29 DIAGNOSIS — M47816 Spondylosis without myelopathy or radiculopathy, lumbar region: Secondary | ICD-10-CM | POA: Diagnosis not present

## 2019-04-29 DIAGNOSIS — M9901 Segmental and somatic dysfunction of cervical region: Secondary | ICD-10-CM | POA: Diagnosis not present

## 2019-04-29 DIAGNOSIS — M9903 Segmental and somatic dysfunction of lumbar region: Secondary | ICD-10-CM | POA: Diagnosis not present

## 2019-04-29 DIAGNOSIS — M4125 Other idiopathic scoliosis, thoracolumbar region: Secondary | ICD-10-CM | POA: Diagnosis not present

## 2019-04-29 DIAGNOSIS — M9902 Segmental and somatic dysfunction of thoracic region: Secondary | ICD-10-CM | POA: Diagnosis not present

## 2019-08-15 DIAGNOSIS — I129 Hypertensive chronic kidney disease with stage 1 through stage 4 chronic kidney disease, or unspecified chronic kidney disease: Secondary | ICD-10-CM | POA: Diagnosis not present

## 2019-08-15 DIAGNOSIS — N1832 Chronic kidney disease, stage 3b: Secondary | ICD-10-CM | POA: Diagnosis not present

## 2019-09-28 ENCOUNTER — Encounter (INDEPENDENT_AMBULATORY_CARE_PROVIDER_SITE_OTHER): Payer: Self-pay

## 2019-09-28 ENCOUNTER — Encounter (INDEPENDENT_AMBULATORY_CARE_PROVIDER_SITE_OTHER): Payer: Self-pay | Admitting: Internal Medicine

## 2019-09-28 ENCOUNTER — Ambulatory Visit (INDEPENDENT_AMBULATORY_CARE_PROVIDER_SITE_OTHER): Payer: Medicare Other | Admitting: Internal Medicine

## 2019-09-28 ENCOUNTER — Other Ambulatory Visit: Payer: Self-pay

## 2019-09-28 VITALS — BP 142/80 | HR 86 | Temp 97.5°F | Resp 18 | Ht 71.0 in | Wt 229.0 lb

## 2019-09-28 DIAGNOSIS — E559 Vitamin D deficiency, unspecified: Secondary | ICD-10-CM | POA: Diagnosis not present

## 2019-09-28 DIAGNOSIS — M130 Polyarthritis, unspecified: Secondary | ICD-10-CM | POA: Diagnosis not present

## 2019-09-28 DIAGNOSIS — R5381 Other malaise: Secondary | ICD-10-CM

## 2019-09-28 DIAGNOSIS — R739 Hyperglycemia, unspecified: Secondary | ICD-10-CM

## 2019-09-28 DIAGNOSIS — E669 Obesity, unspecified: Secondary | ICD-10-CM

## 2019-09-28 DIAGNOSIS — N289 Disorder of kidney and ureter, unspecified: Secondary | ICD-10-CM

## 2019-09-28 DIAGNOSIS — E782 Mixed hyperlipidemia: Secondary | ICD-10-CM | POA: Diagnosis not present

## 2019-09-28 DIAGNOSIS — I10 Essential (primary) hypertension: Secondary | ICD-10-CM | POA: Diagnosis not present

## 2019-09-28 DIAGNOSIS — Z125 Encounter for screening for malignant neoplasm of prostate: Secondary | ICD-10-CM | POA: Diagnosis not present

## 2019-09-28 DIAGNOSIS — R5383 Other fatigue: Secondary | ICD-10-CM

## 2019-09-28 DIAGNOSIS — Z1159 Encounter for screening for other viral diseases: Secondary | ICD-10-CM | POA: Diagnosis not present

## 2019-09-28 NOTE — Progress Notes (Signed)
Metrics: Intervention Frequency ACO  Documented Smoking Status Yearly  Screened one or more times in 24 months  Cessation Counseling or  Active cessation medication Past 24 months  Past 24 months   Guideline developer: UpToDate (See UpToDate for funding source) Date Released: 2014       Wellness Office Visit  Subjective:  Patient ID: Matthew Cohen, male    DOB: 1950/09/06  Age: 69 y.o. MRN: [id number]  CC: This very pleasant 69 year old man comes to our practice to establish care. HPI  He wants to become healthier, lose weight.  He has a history of obesity and hyperlipidemia.  He also has gastroesophageal reflux disease. He also appears to have osteoarthritis and sees a rheumatologist. He describes constant fatigue. He has chronic kidney disease and apparently sees a nephrologist. He has hypertension and takes medications for this.  Thankfully, he has been told that he does not have coronary artery disease when he had a cardiac Catheter in 2015. Past Medical History:  Diagnosis Date  . Anxiety   . Chronic back pain   . Essential hypertension, benign   . GERD (gastroesophageal reflux disease)   . Mixed dyslipidemia    Statin intolerance   Past Surgical History:  Procedure Laterality Date  . LEFT HEART CATHETERIZATION WITH CORONARY ANGIOGRAM N/A 01/06/2014   Procedure: LEFT HEART CATHETERIZATION WITH CORONARY ANGIOGRAM;  Surgeon: Jettie Booze, MD;  Location: [facility] CATH LAB;  Service: Cardiovascular;  Laterality: N/A;     Family History  Problem Relation Age of Onset  . Coronary artery disease Father        CABG at age 69  . Diabetes Father   . Heart disease Father   . Dementia Mother   . Healthy Daughter   . Healthy Daughter   . 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   Social History   Tobacco Use  . Smoking status: Never Smoker  . Smokeless  tobacco: Never Used  Substance Use Topics  . Alcohol use: No    Comment: "Not enough to mention"    Current Meds  Medication Sig  . Acetaminophen (TYLENOL PO) Take 2 tablets by mouth 2 (two) times daily.  . chlorthalidone (HYGROTON) 25 MG tablet Take 12.5 mg by mouth daily. Half a tablet daily   . metoprolol succinate (TOPROL-XL) 25 MG 24 hr tablet Take 25 mg by mouth daily.  Marland Kitchen omeprazole (PRILOSEC) 40 MG capsule Take 40 mg by mouth daily.      Depression screen PHQ 2/9 12/23/2018  Decreased Interest 0  Down, Depressed, Hopeless 0  PHQ - 2 Score 0     Objective:   Today's Vitals: BP (!) 142/80 (BP Location: Right Arm, Patient Position: Sitting, Cuff Size: Normal)   Pulse 86   Temp (!) 97.5 F (36.4 C)   Resp 18   Ht 5\' 11"  (1.803 m)   Wt 229 lb (103.9 kg)   SpO2 97%   BMI 31.94 kg/m  Vitals with BMI 09/28/2019 02/21/2019 01/26/2019  Height 5\' 11"  5\' 11"  -  Weight 229 lbs 228 lbs -  BMI 55.73 22.02 -  Systolic 542 706 237  Diastolic 80 81 83  Pulse 86 55 54     Physical Exam   He looks systemically well but he is obese.  Blood pressure borderline elevated.    Assessment   1. Polyarthritis   2. Essential  hypertension, benign   3. Abnormal renal function   4. Obesity (BMI 30-39.9)   5. Encounter for hepatitis C screening test for low risk patient   6. Malaise and fatigue   7. Special screening for malignant neoplasm of prostate   8. Vitamin D deficiency disease   9. Hyperglycemia, unspecified    10. Mixed hyperlipidemia       Tests ordered Orders Placed This Encounter  Procedures  . Hepatitis C antibody  . CBC  . Cardio IQ Insulin Resistance Panel with Score  . Cardio IQ Adv Lipid and Inflamm Pnl  . COMPLETE METABOLIC PANEL WITH GFR  . Hemoglobin A1c  . PSA, Total with Reflex to PSA, Free  . T3, free  . T4  . TSH  . VITAMIN D 25 Hydroxy (Vit-D Deficiency, Fractures)     Plan: 1. Blood work is ordered above. 2. He will continue with  antihypertensive therapy of chlorthalidone and metoprolol as before. 3. I will see him in a few weeks time to discuss all his blood results and further recommendations going forward. 4. Today I spent 30 minutes with this patient reviewing his medical chart and discussing the philosophy of the practice.   No orders of the defined types were placed in this encounter.   Doree Albee, MD

## 2019-10-04 LAB — CARDIO IQ ADV LIPID AND INFLAMM PNL
Apolipoprotein B: 125 mg/dL — ABNORMAL HIGH (ref <90)
Cholesterol: 201 mg/dL — ABNORMAL HIGH (ref <200)
HDL: 22 mg/dL — ABNORMAL LOW (ref >39)
LDL Cholesterol (Calc): 132 mg/dL (calc) — ABNORMAL HIGH (ref <100)
LDL Large: 4701 nmol/L — ABNORMAL LOW (ref >6729)
LDL Medium: 290 nmol/L — ABNORMAL HIGH (ref <215)
LDL Particle Number: 1815 nmol/L — ABNORMAL HIGH (ref <1138)
LDL Peak Size: 210.3 Angstrom — ABNORMAL LOW (ref >222.9)
LDL Small: 494 nmol/L — ABNORMAL HIGH (ref <142)
Lipoprotein (a): <10 nmol/L (ref <75)
Non-HDL Cholesterol (Calc): 179 mg/dL (calc) — ABNORMAL HIGH (ref <130)
PLAC: 125 nmol/min/mL — ABNORMAL HIGH (ref <124)
Total CHOL/HDL Ratio: 9.1 calc — ABNORMAL HIGH (ref <3.6)
Triglycerides: 327 mg/dL — ABNORMAL HIGH (ref <150)
hs-CRP: 2.7 mg/L — ABNORMAL HIGH (ref <1.0)

## 2019-10-04 LAB — COMPLETE METABOLIC PANEL WITH GFR
AG Ratio: 1.6 (calc) (ref 1.0–2.5)
ALT: 23 U/L (ref 9–46)
AST: 23 U/L (ref 10–35)
Albumin: 4.5 g/dL (ref 3.6–5.1)
Alkaline phosphatase (APISO): 64 U/L (ref 35–144)
BUN/Creatinine Ratio: 14 (calc) (ref 6–22)
BUN: 19 mg/dL (ref 7–25)
CO2: 28 mmol/L (ref 20–32)
Calcium: 9.6 mg/dL (ref 8.6–10.3)
Chloride: 101 mmol/L (ref 98–110)
Creat: 1.33 mg/dL — ABNORMAL HIGH (ref 0.70–1.25)
GFR, Est African American: 63 mL/min/{1.73_m2} (ref >=60)
GFR, Est Non African American: 55 mL/min/{1.73_m2} — ABNORMAL LOW (ref >=60)
Globulin: 2.8 g/dL (calc) (ref 1.9–3.7)
Glucose, Bld: 111 mg/dL — ABNORMAL HIGH (ref 65–99)
Potassium: 3.9 mmol/L (ref 3.5–5.3)
Sodium: 138 mmol/L (ref 135–146)
Total Bilirubin: 0.8 mg/dL (ref 0.2–1.2)
Total Protein: 7.3 g/dL (ref 6.1–8.1)

## 2019-10-04 LAB — CBC
HCT: 43.8 % (ref 38.5–50.0)
Hemoglobin: 14.9 g/dL (ref 13.2–17.1)
MCH: 30.9 pg (ref 27.0–33.0)
MCHC: 34 g/dL (ref 32.0–36.0)
MCV: 90.9 fL (ref 80.0–100.0)
MPV: 11.3 fL (ref 7.5–12.5)
Platelets: 219 10*3/uL (ref 140–400)
RBC: 4.82 10*6/uL (ref 4.20–5.80)
RDW: 13.1 % (ref 11.0–15.0)
WBC: 7.7 10*3/uL (ref 3.8–10.8)

## 2019-10-04 LAB — CARDIO IQ INSULIN RESISTANCE PANEL WITH SCORE
C-PEPTIDE, LC/MS/MS: 5.52 ng/mL — ABNORMAL HIGH (ref 0.68–2.16)
INSULIN, INTACT, LC/MS/MS: 44 u[IU]/mL — ABNORMAL HIGH (ref <=16)
Insulin Resistance Score: 100 — ABNORMAL HIGH (ref <=66)

## 2019-10-04 LAB — HEPATITIS C ANTIBODY
Hepatitis C Ab: NONREACTIVE
SIGNAL TO CUT-OFF: 0.02 (ref <1.00)

## 2019-10-04 LAB — VITAMIN D 25 HYDROXY (VIT D DEFICIENCY, FRACTURES): Vit D, 25-Hydroxy: 27 ng/mL — ABNORMAL LOW (ref 30–100)

## 2019-10-04 LAB — PSA, TOTAL WITH REFLEX TO PSA, FREE: PSA, Total: 1.7 ng/mL (ref <=4.0)

## 2019-10-04 LAB — HEMOGLOBIN A1C
Hgb A1c MFr Bld: 5.5 % of total Hgb (ref <5.7)
Mean Plasma Glucose: 111 (calc)
eAG (mmol/L): 6.2 (calc)

## 2019-10-04 LAB — T3, FREE: T3, Free: 3.1 pg/mL (ref 2.3–4.2)

## 2019-10-04 LAB — T4: T4, Total: 7.7 ug/dL (ref 4.9–10.5)

## 2019-10-04 LAB — TSH: TSH: 1.52 mIU/L (ref 0.40–4.50)

## 2019-10-31 ENCOUNTER — Ambulatory Visit (INDEPENDENT_AMBULATORY_CARE_PROVIDER_SITE_OTHER): Payer: Medicare Other | Admitting: Internal Medicine

## 2019-10-31 ENCOUNTER — Other Ambulatory Visit: Payer: Self-pay

## 2019-10-31 ENCOUNTER — Encounter (INDEPENDENT_AMBULATORY_CARE_PROVIDER_SITE_OTHER): Payer: Self-pay | Admitting: Internal Medicine

## 2019-10-31 VITALS — BP 145/85 | HR 65 | Temp 97.3°F | Ht 71.0 in | Wt 231.2 lb

## 2019-10-31 DIAGNOSIS — E8881 Metabolic syndrome: Secondary | ICD-10-CM | POA: Diagnosis not present

## 2019-10-31 DIAGNOSIS — E782 Mixed hyperlipidemia: Secondary | ICD-10-CM

## 2019-10-31 DIAGNOSIS — I10 Essential (primary) hypertension: Secondary | ICD-10-CM | POA: Diagnosis not present

## 2019-10-31 DIAGNOSIS — E559 Vitamin D deficiency, unspecified: Secondary | ICD-10-CM | POA: Diagnosis not present

## 2019-10-31 NOTE — Patient Instructions (Addendum)
VITAMIN D3 10,000 UNITS/DAY   [facility] Dietary Recommendations for Weight Loss What to Avoid . Avoid added sugars o Often added sugar can be found in processed foods such as many condiments, dry cereals, cakes, cookies, chips, crisps, crackers, candies, sweetened drinks, etc.  o Read labels and AVOID/DECREASE use of foods with the following in their ingredient list: Sugar, fructose, high fructose corn syrup, sucrose, glucose, maltose, dextrose, molasses, cane sugar, brown sugar, any type of syrup, agave nectar, etc.   . Avoid snacking in between meals . Avoid foods made with flour o If you are going to eat food made with flour, choose those made with whole-grains; and, minimize your consumption as much as is tolerable . Avoid processed foods o These foods are generally stocked in the middle of the grocery store. Focus on shopping on the perimeter of the grocery.  . Avoid Meat  o We recommend following a plant-based diet at [facility]. Thus, we recommend avoiding meat as a general rule. Consider eating beans, legumes, eggs, and/or dairy products for regular protein sources o If you plan on eating meat limit to 4 ounces of meat at a time and choose lean options such as Fish, chicken, turkey. Avoid red meat intake such as pork and/or steak What to Include . Vegetables o GREEN LEAFY VEGETABLES: Kale, spinach, mustard greens, collard greens, cabbage, broccoli, etc. o OTHER: Asparagus, cauliflower, eggplant, carrots, peas, Brussel sprouts, tomatoes, bell peppers, zucchini, beets, cucumbers, etc. . Grains, seeds, and legumes o Beans: kidney beans, black eyed peas, garbanzo beans, black beans, pinto beans, etc. o Whole, unrefined grains: brown rice, barley, bulgur, oatmeal, etc. . Healthy fats  o Avoid highly processed fats such as vegetable oil o Examples of healthy fats: avocado, olives, virgin olive oil, dark chocolate (?72% Cocoa), nuts (peanuts, almonds, walnuts,  cashews, pecans, etc.) . None to Low Intake of Animal Sources of Protein o Meat sources: chicken, turkey, salmon, tuna. Limit to 4 ounces of meat at one time. o Consider limiting dairy sources, but when choosing dairy focus on: PLAIN Greek yogurt, cottage cheese, high-protein milk . Fruit o Choose berries  When to Eat . Intermittent Fasting: o Choosing not to eat for a specific time period, but DO FOCUS ON HYDRATION when fasting o Multiple Techniques: - Time Restricted Eating: eat 3 meals in a day, each meal lasting no more than 60 minutes, no snacks between meals - 16-18 hour fast: fast for 16 to 18 hours up to 7 days a week. Often suggested to start with 2-3 nonconsecutive days per week.  . Remember the time you sleep is counted as fasting.  . Examples of eating schedule: Fast from 7:00pm-11:00am. Eat between 11:00am-7:00pm.  - 24-hour fast: fast for 24 hours up to every other day. Often suggested to start with 1 day per week . Remember the time you sleep is counted as fasting . Examples of eating schedule:  o Eating day: eat 2-3 meals on your eating day. If doing 2 meals, each meal should last no more than 90 minutes. If doing 3 meals, each meal should last no more than 60 minutes. Finish last meal by 7:00pm. o Fasting day: Fast until 7:00pm.  o IF YOU FEEL UNWELL FOR ANY REASON/IN ANY WAY WHEN FASTING, STOP FASTING BY EATING A NUTRITIOUS SNACK OR LIGHT MEAL o ALWAYS FOCUS ON HYDRATION DURING FASTS - Acceptable Hydration sources: water, broths, tea/coffee (black tea/coffee is best but using a small amount of whole-fat dairy products   in coffee/tea is acceptable).  - Poor Hydration Sources: anything with sugar or artificial sweeteners added to it  These recommendations have been developed for patients that are actively receiving medical care from either Dr. Oliana Cohen or Matthew Gray, DNP, NP-C at [facility]. These recommendations are developed for patients with specific medical  conditions and are not meant to be distributed or used by others that are not actively receiving care from either provider listed above at [facility]. It is not appropriate to participate in the above eating plans without proper medical supervision.   Reference: Fung, J. The obesity code. [place]: [place]; 2016.   

## 2019-10-31 NOTE — Progress Notes (Signed)
Metrics: Intervention Frequency ACO  Documented Smoking Status Yearly  Screened one or more times in 24 months  Cessation Counseling or  Active cessation medication Past 24 months  Past 24 months   Guideline developer: UpToDate (See UpToDate for funding source) Date Released: 2014       Wellness Office Visit  Subjective:  Patient ID: Matthew Cohen, male    DOB: 09-May-1950  Age: 69 y.o. MRN: [id number]  CC: This man comes in for follow-up regarding his blood work and further recommendations. HPI  I discussed all his blood results with him.  He has vitamin D deficiency.  He has hyperlipidemia with elevated triglycerides and a lipid IQ panel that is unfavorable.  He also has insulin resistance.  This is even though his hemoglobin A1c is 5.5%. Remaining blood work is unremarkable. Past Medical History:  Diagnosis Date  . Anxiety   . Chronic back pain   . Essential hypertension, benign   . GERD (gastroesophageal reflux disease)   . Mixed dyslipidemia    Statin intolerance   Past Surgical History:  Procedure Laterality Date  . LEFT HEART CATHETERIZATION WITH CORONARY ANGIOGRAM N/A 01/06/2014   Procedure: LEFT HEART CATHETERIZATION WITH CORONARY ANGIOGRAM;  Surgeon: Jettie Booze, MD;  Location: [facility] CATH LAB;  Service: Cardiovascular;  Laterality: N/A;     Family History  Problem Relation Age of Onset  . Coronary artery disease Father        CABG at age 81  . Diabetes Father   . Heart disease Father   . Dementia Mother   . Healthy Daughter   . Healthy Daughter   . 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   Social History   Tobacco Use  . Smoking status: Never Smoker  . Smokeless tobacco: Never Used  Substance Use Topics  . Alcohol use: No    Comment: "Not enough to mention"    Current Meds  Medication Sig  . metoprolol succinate (TOPROL-XL) 25 MG 24  hr tablet Take 25 mg by mouth daily.  Marland Kitchen omeprazole (PRILOSEC) 40 MG capsule Take 40 mg by mouth daily.      Depression screen PHQ 2/9 12/23/2018  Decreased Interest 0  Down, Depressed, Hopeless 0  PHQ - 2 Score 0     Objective:   Today's Vitals: BP (!) 145/85 (BP Location: Left Arm, Patient Position: Sitting, Cuff Size: Normal)   Pulse 65   Temp (!) 97.3 F (36.3 C) (Temporal)   Ht 5\' 11"  (1.803 m)   Wt 231 lb 3.2 oz (104.9 kg)   SpO2 98%   BMI 32.25 kg/m  Vitals with BMI 10/31/2019 09/28/2019 02/21/2019  Height 5\' 11"  5\' 11"  5\' 11"   Weight 231 lbs 3 oz 229 lbs 228 lbs  BMI 32.26 99.83 38.25  Systolic 053 976 734  Diastolic 85 80 81  Pulse 65 86 55     Physical Exam   He has gained a couple pounds since last visit and remains obese.  Blood pressure borderline elevated now.    Assessment   1. Essential hypertension, benign   2. Vitamin D deficiency disease   3. Mixed hyperlipidemia   4. Insulin resistance       Tests ordered No orders of the defined types were placed in this encounter.    Plan: 1. We discussed all his results in detail. 2. I recommended vitamin  D3 10,000 units daily. 3. As far as his hyperlipidemia and insulin resistance is concerned, I focused today on nutrition and the concept of intermittent fasting combined with a plant-based diet.  I made sure that he understands he needs to be well-hydrated.  I have given him a diet sheet. 4. He will continue with the same antihypertensive medications he is on for the time being and hopefully we can reduce these as he reduce his weight. 5. Follow-up in 6 weeks.   No orders of the defined types were placed in this encounter.   Doree Albee, MD

## 2019-11-01 ENCOUNTER — Ambulatory Visit (INDEPENDENT_AMBULATORY_CARE_PROVIDER_SITE_OTHER): Payer: Medicare Other | Admitting: Internal Medicine

## 2019-12-13 ENCOUNTER — Other Ambulatory Visit: Payer: Self-pay

## 2019-12-13 ENCOUNTER — Ambulatory Visit (INDEPENDENT_AMBULATORY_CARE_PROVIDER_SITE_OTHER): Payer: Medicare Other | Admitting: Internal Medicine

## 2019-12-13 ENCOUNTER — Encounter (INDEPENDENT_AMBULATORY_CARE_PROVIDER_SITE_OTHER): Payer: Self-pay | Admitting: Internal Medicine

## 2019-12-13 VITALS — BP 125/80 | HR 71 | Temp 97.7°F | Ht 71.0 in | Wt 230.0 lb

## 2019-12-13 DIAGNOSIS — E8881 Metabolic syndrome: Secondary | ICD-10-CM

## 2019-12-13 DIAGNOSIS — E559 Vitamin D deficiency, unspecified: Secondary | ICD-10-CM | POA: Diagnosis not present

## 2019-12-13 DIAGNOSIS — I10 Essential (primary) hypertension: Secondary | ICD-10-CM | POA: Diagnosis not present

## 2019-12-13 DIAGNOSIS — E782 Mixed hyperlipidemia: Secondary | ICD-10-CM

## 2019-12-13 NOTE — Progress Notes (Signed)
Metrics: Intervention Frequency ACO  Documented Smoking Status Yearly  Screened one or more times in 24 months  Cessation Counseling or  Active cessation medication Past 24 months  Past 24 months   Guideline developer: UpToDate (See UpToDate for funding source) Date Released: 2014       Wellness Office Visit  Subjective:  Patient ID: Matthew Cohen, male    DOB: 11-30-1950  Age: 69 y.o. MRN: [id number]  CC: This man comes in for follow-up of hypertension, vitamin D deficiency, insulin resistance and hyperlipidemia. HPI  He continues on metoprolol twice a day for his hypertension and this seems to be keeping his blood pressure under good control. He has been trying to change his diet now and does not eat meat as frequently as he used to and he and his girlfriend eat beans on most days of the week. He has been taking vitamin D3 10,000 units daily for vitamin D deficiency. Past Medical History:  Diagnosis Date  . Anxiety   . Chronic back pain   . Essential hypertension, benign   . GERD (gastroesophageal reflux disease)   . Mixed dyslipidemia    Statin intolerance   Past Surgical History:  Procedure Laterality Date  . LEFT HEART CATHETERIZATION WITH CORONARY ANGIOGRAM N/A 01/06/2014   Procedure: LEFT HEART CATHETERIZATION WITH CORONARY ANGIOGRAM;  Surgeon: Jettie Booze, MD;  Location: [facility] CATH LAB;  Service: Cardiovascular;  Laterality: N/A;     Family History  Problem Relation Age of Onset  . Coronary artery disease Father        CABG at age 46  . Diabetes Father   . Heart disease Father   . Dementia Mother   . Healthy Daughter   . Healthy Daughter   . 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   Social History   Tobacco Use  . Smoking status: Never Smoker  . Smokeless tobacco: Never Used  Substance Use Topics  . Alcohol use: No    Comment: "Not enough  to mention"    Current Meds  Medication Sig  . metoprolol succinate (TOPROL-XL) 25 MG 24 hr tablet Take 25 mg by mouth 2 (two) times daily.   Marland Kitchen omeprazole (PRILOSEC) 40 MG capsule Take 40 mg by mouth daily.      Depression screen PHQ 2/9 12/23/2018  Decreased Interest 0  Down, Depressed, Hopeless 0  PHQ - 2 Score 0     Objective:   Today's Vitals: BP 125/80 (BP Location: Left Arm, Patient Position: Sitting, Cuff Size: Normal)   Pulse 71   Temp 97.7 F (36.5 C) (Temporal)   Ht 5\' 11"  (1.803 m)   Wt 230 lb (104.3 kg)   SpO2 97%   BMI 32.08 kg/m  Vitals with BMI 12/13/2019 10/31/2019 09/28/2019  Height 5\' 11"  5\' 11"  5\' 11"   Weight 230 lbs 231 lbs 3 oz 229 lbs  BMI 32.09 54.27 06.23  Systolic 762 831 517  Diastolic 80 85 80  Pulse 71 65 86     Physical Exam  He looks systemically well.  He has not lost any significant weight since the last time I saw him but his blood pressure is well controlled compared to last time.  He is alert and orientated without any focal neurological signs.     Assessment   1. Essential hypertension, benign   2. Mixed hyperlipidemia   3. Insulin  resistance   4. Vitamin D deficiency disease       Tests ordered Orders Placed This Encounter  Procedures  . COMPLETE METABOLIC PANEL WITH GFR  . VITAMIN D 25 Hydroxy (Vit-D Deficiency, Fractures)     Plan: 1. He will continue with metoprolol twice a day for his hypertension which is keeping his blood pressure under good control. 2. He will focus on continuing to change his nutrition to a healthier diet, focusing on reducing animal protein and replacing it with a plant-based diet, incorporating beans and nuts almost on a daily basis. 3. He will continue with vitamin D3 supplementation 10,000 units daily and we will check levels today. 4. Today, I counseled him today and strongly recommended COVID-19 vaccination.  He asked me about the possibility of ivermectin and I told him there is no  evidence that this is of any value in COVID-19 disease. 5. Follow-up in 3 to 4 months time and further recommendations will depend on blood results.   No orders of the defined types were placed in this encounter.   Doree Albee, MD

## 2019-12-14 LAB — COMPLETE METABOLIC PANEL WITH GFR
AG Ratio: 1.7 (calc) (ref 1.0–2.5)
ALT: 23 U/L (ref 9–46)
AST: 23 U/L (ref 10–35)
Albumin: 4.7 g/dL (ref 3.6–5.1)
Alkaline phosphatase (APISO): 66 U/L (ref 35–144)
BUN: 21 mg/dL (ref 7–25)
CO2: 29 mmol/L (ref 20–32)
Calcium: 10 mg/dL (ref 8.6–10.3)
Chloride: 100 mmol/L (ref 98–110)
Creat: 1.25 mg/dL (ref 0.70–1.25)
GFR, Est African American: 68 mL/min/{1.73_m2} (ref >=60)
GFR, Est Non African American: 59 mL/min/{1.73_m2} — ABNORMAL LOW (ref >=60)
Globulin: 2.7 g/dL (calc) (ref 1.9–3.7)
Glucose, Bld: 117 mg/dL — ABNORMAL HIGH (ref 65–99)
Potassium: 4.1 mmol/L (ref 3.5–5.3)
Sodium: 141 mmol/L (ref 135–146)
Total Bilirubin: 0.5 mg/dL (ref 0.2–1.2)
Total Protein: 7.4 g/dL (ref 6.1–8.1)

## 2019-12-14 LAB — VITAMIN D 25 HYDROXY (VIT D DEFICIENCY, FRACTURES): Vit D, 25-Hydroxy: 40 ng/mL (ref 30–100)

## 2019-12-14 NOTE — Progress Notes (Signed)
Let the patient know that vitamin D levels are better and kidney function is also good.  Continue with everything as before.  Follow-up as scheduled.

## 2019-12-15 NOTE — Progress Notes (Signed)
Pt was called and given lab results. Pt was happy to get good report back. Pt was also asking to get copies  e-mail. I Assisted to help him set up the Mychart on his phone or pc. Sending new link to reconnect. Explained we cannot send e-mail due to HIPPA and [place] policies. PT will work on this and get setup for future .

## 2020-03-27 ENCOUNTER — Ambulatory Visit (INDEPENDENT_AMBULATORY_CARE_PROVIDER_SITE_OTHER): Payer: Medicare Other | Admitting: Internal Medicine

## 2020-05-08 HISTORY — PX: TOOTH EXTRACTION: SUR596

## 2020-05-10 ENCOUNTER — Encounter (INDEPENDENT_AMBULATORY_CARE_PROVIDER_SITE_OTHER): Payer: Self-pay | Admitting: Internal Medicine

## 2020-05-10 ENCOUNTER — Other Ambulatory Visit: Payer: Self-pay

## 2020-05-10 ENCOUNTER — Ambulatory Visit (INDEPENDENT_AMBULATORY_CARE_PROVIDER_SITE_OTHER): Payer: Medicare Other | Admitting: Internal Medicine

## 2020-05-10 VITALS — BP 122/76 | HR 59 | Temp 97.7°F | Ht 71.0 in | Wt 223.8 lb

## 2020-05-10 DIAGNOSIS — E559 Vitamin D deficiency, unspecified: Secondary | ICD-10-CM | POA: Diagnosis not present

## 2020-05-10 DIAGNOSIS — E7489 Other specified disorders of carbohydrate metabolism: Secondary | ICD-10-CM

## 2020-05-10 DIAGNOSIS — E782 Mixed hyperlipidemia: Secondary | ICD-10-CM | POA: Diagnosis not present

## 2020-05-10 DIAGNOSIS — I10 Essential (primary) hypertension: Secondary | ICD-10-CM | POA: Diagnosis not present

## 2020-05-10 DIAGNOSIS — E669 Obesity, unspecified: Secondary | ICD-10-CM | POA: Diagnosis not present

## 2020-05-10 DIAGNOSIS — Z131 Encounter for screening for diabetes mellitus: Secondary | ICD-10-CM | POA: Diagnosis not present

## 2020-05-10 NOTE — Progress Notes (Signed)
Metrics: Intervention Frequency ACO  Documented Smoking Status Yearly  Screened one or more times in 24 months  Cessation Counseling or  Active cessation medication Past 24 months  Past 24 months   Guideline developer: UpToDate (See UpToDate for funding source) Date Released: 2014       Wellness Office Visit  Subjective:  Patient ID: Matthew Cohen, male    DOB: [birth year]  Age: 70 y.o. MRN: [id number]  CC: This very pleasant man comes in for follow-up of hypertension, vitamin D deficiency, obesity and dyslipidemia. HPI He says he is doing well and trying to follow more of a plant-based diet and has managed to lose weight. He continues with vitamin D3 10,000 units daily. He was concerned about the possibility of diabetes because at 1 time, he noticed that his blood sugar was in the low 60s and he felt unwell and dizzy and after having some food, he has felt somewhat better.  His last hemoglobin A1c in June of last year was normal. He certainly has dyslipidemia with elevated LDL particle numbers. Thankfully, he has not had a history of coronary artery disease.  Past Medical History:  Diagnosis Date  . Anxiety   . Chronic back pain   . Essential hypertension, benign   . GERD (gastroesophageal reflux disease)   . Mixed dyslipidemia    Statin intolerance   Past Surgical History:  Procedure Laterality Date  . LEFT HEART CATHETERIZATION WITH CORONARY ANGIOGRAM N/A 01/06/2014   Procedure: LEFT HEART CATHETERIZATION WITH CORONARY ANGIOGRAM;  Surgeon: Jettie Booze, MD;  Location: [facility] CATH LAB;  Service: Cardiovascular;  Laterality: N/A;  . TOOTH EXTRACTION  05/08/2020     Family History  Problem Relation Age of Onset  . Coronary artery disease Father        CABG at age 14  . Diabetes Father   . Heart disease Father   . Dementia Mother   . Healthy Daughter   . Healthy Daughter   . 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   Social History   Tobacco Use  . Smoking status: Never Smoker  . Smokeless tobacco: Never Used  Substance Use Topics  . Alcohol use: No    Comment: "Not enough to mention"    Current Meds  Medication Sig  . chlorthalidone (HYGROTON) 25 MG tablet Take 12.5 mg by mouth every morning.  . metoprolol succinate (TOPROL-XL) 25 MG 24 hr tablet Take 25 mg by mouth 2 (two) times daily.   Marland Kitchen omeprazole (PRILOSEC) 40 MG capsule Take 40 mg by mouth daily.      Depression screen [facility] 2/9 05/10/2020 12/23/2018  Decreased Interest 0 0  Down, Depressed, Hopeless 0 0  PHQ - 2 Score 0 0  Altered sleeping 0 -  Tired, decreased energy 0 -  Change in appetite 0 -  Feeling bad or failure about yourself  0 -  Trouble concentrating 0 -  Moving slowly or fidgety/restless 0 -  Suicidal thoughts 0 -  PHQ-9 Score 0 -  Difficult doing work/chores Not difficult at all -     Objective:   Today's Vitals: BP 122/76   Pulse (!) 59   Temp 97.7 F (36.5 C) (Temporal)   Ht 5\' 11"  (1.803 m)   Wt 223 lb 12.8 oz (101.5 kg)   SpO2 96%   BMI 31.21 kg/m  Vitals with BMI 05/10/2020  12/13/2019 10/31/2019  Height 5\' 11"  5\' 11"  5\' 11"   Weight 223 lbs 13 oz 230 lbs 231 lbs 3 oz  BMI 31.23 81.01 75.10  Systolic 258 527 782  Diastolic 76 80 85  Pulse 59 71 65     Physical Exam  He remains obese but he has lost 7 pounds since the last visit.  Blood pressure is in a good range.     Assessment   1. Essential hypertension, benign   2. Vitamin D deficiency disease   3. Obesity (BMI 30-39.9)   4. Mixed hyperlipidemia   5. Screening for diabetes mellitus   6. 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)        Tests ordered Orders Placed This Encounter  Procedures  . COMPLETE METABOLIC PANEL WITH GFR  . VITAMIN D 25 Hydroxy (Vit-D Deficiency, Fractures)  . Lipid panel  . Hemoglobin A1c     Plan: 1. He will  continue with chlorthalidone and metoprolol for hypertension. 2. He will continue to work on diet and hopefully his dyslipidemia has improved.  We will check a lipid panel today. 3. He will continue with vitamin D3 10,000 units daily and I will repeat a vitamin D level. 4. I will check an A1c as there was concern regarding diabetes. 5. I also addressed with him ivermectin.  He told me that he has ivermectin in his possession and he is storing it for whenever he might need it due to COVID-19 disease.  I have told him that there is no evidence at the present time supporting the use of ivermectin and COVID-19 disease.  I also informed him that should he take this without informing me or discussing it with me, he may be dismissed from the practice. 6. Follow-up with Judson Roch in the next 3 to 4 months.   No orders of the defined types were placed in this encounter.   Doree Albee, MD

## 2020-05-11 LAB — COMPLETE METABOLIC PANEL WITH GFR
AG Ratio: 1.7 (calc) (ref 1.0–2.5)
ALT: 17 U/L (ref 9–46)
AST: 22 U/L (ref 10–35)
Albumin: 4.6 g/dL (ref 3.6–5.1)
Alkaline phosphatase (APISO): 68 U/L (ref 35–144)
BUN/Creatinine Ratio: 14 (calc) (ref 6–22)
BUN: 18 mg/dL (ref 7–25)
CO2: 31 mmol/L (ref 20–32)
Calcium: 9.9 mg/dL (ref 8.6–10.3)
Chloride: 100 mmol/L (ref 98–110)
Creat: 1.26 mg/dL — ABNORMAL HIGH (ref 0.70–1.25)
GFR, Est African American: 67 mL/min/{1.73_m2} (ref >=60)
GFR, Est Non African American: 58 mL/min/{1.73_m2} — ABNORMAL LOW (ref >=60)
Globulin: 2.7 g/dL (calc) (ref 1.9–3.7)
Glucose, Bld: 92 mg/dL (ref 65–139)
Potassium: 4 mmol/L (ref 3.5–5.3)
Sodium: 140 mmol/L (ref 135–146)
Total Bilirubin: 0.5 mg/dL (ref 0.2–1.2)
Total Protein: 7.3 g/dL (ref 6.1–8.1)

## 2020-05-11 LAB — LIPID PANEL
Cholesterol: 204 mg/dL — ABNORMAL HIGH (ref <200)
HDL: 26 mg/dL — ABNORMAL LOW (ref >=40)
LDL Cholesterol (Calc): 140 mg/dL (calc) — ABNORMAL HIGH
Non-HDL Cholesterol (Calc): 178 mg/dL (calc) — ABNORMAL HIGH (ref <130)
Total CHOL/HDL Ratio: 7.8 (calc) — ABNORMAL HIGH (ref <5.0)
Triglycerides: 250 mg/dL — ABNORMAL HIGH (ref <150)

## 2020-05-11 LAB — HEMOGLOBIN A1C
Hgb A1c MFr Bld: 5.7 % of total Hgb — ABNORMAL HIGH (ref <5.7)
Mean Plasma Glucose: 117 mg/dL
eAG (mmol/L): 6.5 mmol/L

## 2020-05-11 LAB — VITAMIN D 25 HYDROXY (VIT D DEFICIENCY, FRACTURES): Vit D, 25-Hydroxy: 79 ng/mL (ref 30–100)

## 2020-07-26 ENCOUNTER — Telehealth (INDEPENDENT_AMBULATORY_CARE_PROVIDER_SITE_OTHER): Payer: Self-pay

## 2020-07-26 ENCOUNTER — Other Ambulatory Visit (INDEPENDENT_AMBULATORY_CARE_PROVIDER_SITE_OTHER): Payer: Self-pay | Admitting: Nurse Practitioner

## 2020-07-26 DIAGNOSIS — I10 Essential (primary) hypertension: Secondary | ICD-10-CM

## 2020-07-26 DIAGNOSIS — K219 Gastro-esophageal reflux disease without esophagitis: Secondary | ICD-10-CM

## 2020-07-26 MED ORDER — METOPROLOL SUCCINATE ER 25 MG PO TB24: 25.0000 mg | ORAL_TABLET | Freq: Two times a day (BID) | ORAL | 1 refills | Status: DC

## 2020-07-26 MED ORDER — OMEPRAZOLE 40 MG PO CPDR: 40.0000 mg | DELAYED_RELEASE_CAPSULE | Freq: Every day | ORAL | 1 refills | Status: AC

## 2020-07-26 NOTE — Telephone Encounter (Signed)
Patient called and needs a refill for the following medications sent to [place]:  metoprolol succinate (TOPROL-XL) 25 MG 24 hr tablet   omeprazole (PRILOSEC) 40 MG capsule   90 days please. Thank you!

## 2020-07-26 NOTE — Telephone Encounter (Signed)
Done. Thank you.

## 2020-08-02 ENCOUNTER — Encounter (INDEPENDENT_AMBULATORY_CARE_PROVIDER_SITE_OTHER): Payer: Self-pay | Admitting: Nurse Practitioner

## 2020-08-02 ENCOUNTER — Other Ambulatory Visit: Payer: Self-pay

## 2020-08-02 ENCOUNTER — Ambulatory Visit (INDEPENDENT_AMBULATORY_CARE_PROVIDER_SITE_OTHER): Payer: Medicare Other | Admitting: Nurse Practitioner

## 2020-08-02 VITALS — BP 120/72 | HR 65 | Temp 98.1°F | Ht 69.75 in | Wt 220.6 lb

## 2020-08-02 DIAGNOSIS — Z0000 Encounter for general adult medical examination without abnormal findings: Secondary | ICD-10-CM | POA: Diagnosis not present

## 2020-08-02 NOTE — Progress Notes (Signed)
Subjective:   VERLAND SPRINKLE is a 70 y.o. male who presents for Medicare Annual/Subsequent preventive examination.  Review of Systems     Cardiac Risk Factors include: advanced age (>36men, >68 women);dyslipidemia;hypertension     Objective:    Today's Vitals   08/02/20 1459  BP: 120/72  Pulse: 65  Temp: 98.1 F (36.7 C)  TempSrc: Temporal  SpO2: 97%  Weight: 220 lb 9.6 oz (100.1 kg)  Height: 5' 9.75" (1.772 m)   Body mass index is 31.88 kg/m.  Advanced Directives 08/02/2020 01/06/2014  Does Patient Have a Medical Advance Directive? No No  Would patient like information on creating a medical advance directive? No - Patient declined Yes - Educational materials given    Current Medications (verified) Outpatient Encounter Medications as of 08/02/2020  Medication Sig  . chlorthalidone (HYGROTON) 25 MG tablet Take 12.5 mg by mouth every morning.  . metoprolol succinate (TOPROL-XL) 25 MG 24 hr tablet Take 1 tablet (25 mg total) by mouth 2 (two) times daily.  Marland Kitchen omeprazole (PRILOSEC) 40 MG capsule Take 1 capsule (40 mg total) by mouth daily.  Marland Kitchen ivermectin (STROMECTOL) 3 MG TABS tablet SMARTSIG:7 Tablet(s) By Mouth Daily (Patient not taking: No sig reported)   No facility-administered encounter medications on file as of 08/02/2020.    Allergies (verified) Patient has no known allergies.   History: Past Medical History:  Diagnosis Date  . Anxiety   . Chronic back pain   . Essential hypertension, benign   . GERD (gastroesophageal reflux disease)   . Mixed dyslipidemia    Statin intolerance   Past Surgical History:  Procedure Laterality Date  . LEFT HEART CATHETERIZATION WITH CORONARY ANGIOGRAM N/A 01/06/2014   Procedure: LEFT HEART CATHETERIZATION WITH CORONARY ANGIOGRAM;  Surgeon: Jettie Booze, MD;  Location: [facility] CATH LAB;  Service: Cardiovascular;  Laterality: N/A;  . TOOTH EXTRACTION  05/08/2020   Family History  Problem Relation Age of Onset  . Coronary  artery disease Father        CABG at age 7  . Diabetes Father   . Heart disease Father   . Dementia Mother   . Healthy Daughter   . Healthy Daughter   . Healthy Daughter    Social History   Socioeconomic History  . Marital status: Significant Other    Spouse name: Not on file  . Number of children: Not on file  . Years of education: Not on file  . Highest education level: Not on file  Occupational History  . Occupation: [employer]    Comment: hobby  Tobacco Use  . Smoking status: Never Smoker  . Smokeless tobacco: Never Used  Vaping Use  . Vaping Use: Never used  Substance and Sexual Activity  . Alcohol use: No    Comment: "Not enough to mention"  . Drug use: No  . Sexual activity: Yes  Other Topics Concern  . 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   Social Determinants of Health   Financial Resource Strain: Not on file  Food Insecurity: Not on file  Transportation Needs: Not on file  Physical Activity: Not on file  Stress: Not on file  Social Connections: Not on file    Tobacco Counseling Counseling given: Yes   Clinical Intake:  Pre-visit preparation completed: Yes  Pain : No/denies pain     BMI - recorded: 31.88 Nutritional Status: BMI > 30  Obese Nutritional  Risks: None Diabetes: No  How often do you need to have someone help you when you read instructions, pamphlets, or other written materials from your doctor or pharmacy?: 1 - Never What is the last grade level you completed in school?: 12th grade  Diabetic? No  Interpreter Needed?: No  Information entered by :: Jeralyn Ruths, NP-C   Activities of Daily Living In your present state of health, do you have any difficulty performing the following activities: 08/02/2020  Hearing? N  Vision? N  Difficulty concentrating or making decisions? Y  Walking or climbing stairs? N  Dressing or bathing? N  Doing  errands, shopping? N  Preparing Food and eating ? N  Using the Toilet? N  In the past six months, have you accidently leaked urine? N  Do you have problems with loss of bowel control? N  Managing your Medications? N  Managing your Finances? N  Housekeeping or managing your Housekeeping? N  Some recent data might be hidden    Patient Care Team: Doree Albee, MD as PCP - General (Internal Medicine) Rexene Agent, MD as Attending Physician (Nephrology)  Indicate any recent Medical Services you may have received from other than [facility] providers in the past year (date may be approximate).     Assessment:   This is a routine wellness examination for Carlon.  Hearing/Vision screen No exam data present  Dietary issues and exercise activities discussed: Current Exercise Habits: The patient does not participate in regular exercise at present, Exercise limited by: None identified  Goals   None    Depression Screen PHQ 2/9 Scores 08/02/2020 05/10/2020 12/23/2018  PHQ - 2 Score 0 0 0  PHQ- 9 Score 0 0 -    Fall Risk Fall Risk  08/02/2020 12/23/2018  Falls in the past year? 0 0  Number falls in past yr: - 0  Injury with Fall? - 0    FALL RISK PREVENTION PERTAINING TO THE HOME:  Any stairs in or around the home? Yes  If so, are there any without handrails? Yes  Home free of loose throw rugs in walkways, pet beds, electrical cords, etc? Yes  Adequate lighting in your home to reduce risk of falls? Yes   ASSISTIVE DEVICES UTILIZED TO PREVENT FALLS:  Life alert? No  Use of a cane, walker or w/c? No  Grab bars in the bathroom? No  Shower chair or bench in shower? No  Elevated toilet seat or a handicapped toilet? No   TIMED UP AND GO:  Was the test performed? Yes .  Length of time to ambulate 10 feet: 7 sec.   Gait steady and fast without use of assistive device  Cognitive Function:     6CIT Screen 08/02/2020  What Year? 0 points  What month? 0 points  What time? 0  points  Count back from 20 0 points  Months in reverse 0 points  Repeat phrase 2 points  Total Score 2    Immunizations  There is no immunization history on file for this patient.  TDAP status: Due, Education has been provided regarding the importance of this vaccine. Advised may receive this vaccine at local pharmacy or Health Dept. Aware to provide a copy of the vaccination record if obtained from local pharmacy or Health Dept. Verbalized acceptance and understanding.  Flu Vaccine status: Declined, Education has been provided regarding the importance of this vaccine but patient still declined. Advised may receive this vaccine at local pharmacy or Health Dept.  Aware to provide a copy of the vaccination record if obtained from local pharmacy or Health Dept. Verbalized acceptance and understanding.  Pneumococcal vaccine status: Declined,  Education has been provided regarding the importance of this vaccine but patient still declined. Advised may receive this vaccine at local pharmacy or Health Dept. Aware to provide a copy of the vaccination record if obtained from local pharmacy or Health Dept. Verbalized acceptance and understanding.   Covid-19 vaccine status: Declined, Education has been provided regarding the importance of this vaccine but patient still declined. Advised may receive this vaccine at local pharmacy or Health Dept.or vaccine clinic. Aware to provide a copy of the vaccination record if obtained from local pharmacy or Health Dept. Verbalized acceptance and understanding.  Qualifies for Shingles Vaccine? Yes   Zostavax completed No   Shingrix Completed?: No.    Education has been provided regarding the importance of this vaccine. Patient has been advised to call insurance company to determine out of pocket expense if they have not yet received this vaccine. Advised may also receive vaccine at local pharmacy or Health Dept. Verbalized acceptance and understanding.  Screening  Tests Health Maintenance  Topic Date Due  . COVID-19 Vaccine (1) Never done  . TETANUS/TDAP  Never done  . PNA vac Low Risk Adult (1 of 2 - PCV13) Never done  . INFLUENZA VACCINE  11/12/2020  . COLONOSCOPY (Pts 45-34yrs Insurance coverage will need to be confirmed)  03/02/2030  . Hepatitis C Screening  Completed  . HPV VACCINES  Aged Out    Health Maintenance  Health Maintenance Due  Topic Date Due  . COVID-19 Vaccine (1) Never done  . TETANUS/TDAP  Never done  . PNA vac Low Risk Adult (1 of 2 - PCV13) Never done    Colorectal cancer screening: Type of screening: Colonoscopy. Completed 02/2020. Repeat every 3 years  Lung Cancer Screening: (Low Dose CT Chest recommended if Age 69-80 years, 30 pack-year currently smoking OR have quit w/in 15years.) does not qualify.   Lung Cancer Screening Referral: N/A  Additional Screening:  Hepatitis C Screening: does not qualify; Completed 09/2019  Vision Screening: Recommended annual ophthalmology exams for early detection of glaucoma and other disorders of the eye. Is the patient up to date with their annual eye exam?  Yes  Who is the provider or what is the name of the office in which the patient attends annual eye exams? [place] If pt is not established with a provider, would they like to be referred to a provider to establish care? No .   Dental Screening: Recommended annual dental exams for proper oral hygiene  Community Resource Referral / Chronic Care Management: CRR required this visit?  No   CCM required this visit?  No      Plan:   Discussed recommended screenings and vaccinations.   I have personally reviewed and noted the following in the patient's chart:   . Medical and social history . Use of alcohol, tobacco or illicit drugs  . Current medications and supplements . Functional ability and status . Nutritional status . Physical activity . Advanced directives . List of other  physicians . Hospitalizations, surgeries, and ER visits in previous 12 months . Vitals . Screenings to include cognitive, depression, and falls . Referrals and appointments  In addition, I have reviewed and discussed with patient certain preventive protocols, quality metrics, and best practice recommendations. A written personalized care plan for preventive services as well as general preventive health  recommendations were provided to patient.     He will follow-up as scheduled next month, or sooner as needed  Ailene Ards, NP   08/02/2020

## 2020-08-02 NOTE — Patient Instructions (Addendum)
Matthew Cohen , Thank you for taking time to come for your Medicare Wellness Visit. I appreciate your ongoing commitment to your health goals. Please review the following plan we discussed and let me know if I can assist you in the future.   These are the goals we discussed: Goals   None     This is a list of the screening recommended for you and due dates:  Health Maintenance  Topic Date Due  . COVID-19 Vaccine (1) 08/18/2020*  . Tetanus Vaccine  08/02/2021*  . Pneumonia vaccines (1 of 2 - PCV13) 08/02/2021*  . Flu Shot  11/12/2020  . Colon Cancer Screening  03/02/2030  .  Hepatitis C: One time screening is recommended by Center for Disease Control  (CDC) for  adults born from [birth year] through 1964.   Completed  . HPV Vaccine  Aged Out  *Topic was postponed. The date shown is not the original due date.    [facility] Dietary Recommendations for Weight Loss What to Avoid . Avoid added sugars o Often added sugar can be found in processed foods such as many condiments, dry cereals, cakes, cookies, chips, crisps, crackers, candies, sweetened drinks, etc.  o Read labels and AVOID/DECREASE use of foods with the following in their ingredient list: Sugar, fructose, high fructose corn syrup, sucrose, glucose, maltose, dextrose, molasses, cane sugar, brown sugar, any type of syrup, agave nectar, etc.   . Avoid snacking in between meals . Avoid foods made with flour o If you are going to eat food made with flour, choose those made with whole-grains; and, minimize your consumption as much as is tolerable . Avoid processed foods o These foods are generally stocked in the middle of the grocery store. Focus on shopping on the perimeter of the grocery.  . Avoid Meat  o We recommend following a plant-based diet at [facility]. Thus, we recommend avoiding meat as a general rule. Consider eating beans, legumes, eggs, and/or dairy products for regular protein sources o If you plan on  eating meat limit to 4 ounces of meat at a time and choose lean options such as Fish, chicken, [place]. Avoid red meat intake such as pork and/or steak What to Include . Vegetables o GREEN LEAFY VEGETABLES: Kale, spinach, mustard greens, collard greens, cabbage, broccoli, etc. o OTHER: Asparagus, cauliflower, eggplant, carrots, peas, Brussel sprouts, tomatoes, bell peppers, zucchini, beets, cucumbers, etc. . Grains, seeds, and legumes o Beans: kidney beans, black eyed peas, garbanzo beans, black beans, pinto beans, etc. o Whole, unrefined grains: brown rice, barley, bulgur, oatmeal, etc. . Healthy fats  o Avoid highly processed fats such as vegetable oil o Examples of healthy fats: avocado, olives, virgin olive oil, dark chocolate (?72% Cocoa), nuts (peanuts, almonds, walnuts, cashews, pecans, etc.) . None to Low Intake of Animal Sources of Protein o Meat sources: chicken, [place], salmon, tuna. Limit to 4 ounces of meat at one time. o Consider limiting dairy sources, but when choosing dairy focus on: PLAIN [place] yogurt, cottage cheese, high-protein milk . Fruit o Choose berries  When to Eat . Intermittent Fasting: o Choosing not to eat for a specific time period, but DO FOCUS ON HYDRATION when fasting o Multiple Techniques: - Time Restricted Eating: eat 3 meals in a day, each meal lasting no more than 60 minutes, no snacks between meals - 16-18 hour fast: fast for 16 to 18 hours up to 7 days a week. Often suggested to start with 2-3 nonconsecutive days per  week.  . Remember the time you sleep is counted as fasting.  . Examples of eating schedule: Fast from 7:00pm-11:00am. Eat between 11:00am-7:00pm.  - 24-hour fast: fast for 24 hours up to every other day. Often suggested to start with 1 day per week . Remember the time you sleep is counted as fasting . Examples of eating schedule:  o Eating day: eat 2-3 meals on your eating day. If doing 2 meals, each meal should last no more than 90  minutes. If doing 3 meals, each meal should last no more than 60 minutes. Finish last meal by 7:00pm. o Fasting day: Fast until 7:00pm.  o IF YOU FEEL UNWELL FOR ANY REASON/IN ANY WAY WHEN FASTING, STOP FASTING BY EATING A NUTRITIOUS SNACK OR LIGHT MEAL o ALWAYS FOCUS ON HYDRATION DURING FASTS - Acceptable Hydration sources: water, broths, tea/coffee (black tea/coffee is best but using a small amount of whole-fat dairy products in coffee/tea is acceptable).  - Poor Hydration Sources: anything with sugar or artificial sweeteners added to it  These recommendations have been developed for patients that are actively receiving medical care from either Dr. Anastasio Champion or Jeralyn Ruths, DNP, NP-C at [facility]. These recommendations are developed for patients with specific medical conditions and are not meant to be distributed or used by others that are not actively receiving care from either provider listed above at [facility]. It is not appropriate to participate in the above eating plans without proper medical supervision.   Reference: Rexanne Mano. The obesity code. [place]; 2016.

## 2020-08-08 DIAGNOSIS — N1832 Chronic kidney disease, stage 3b: Secondary | ICD-10-CM | POA: Diagnosis not present

## 2020-08-14 ENCOUNTER — Encounter (INDEPENDENT_AMBULATORY_CARE_PROVIDER_SITE_OTHER): Payer: Self-pay

## 2020-08-27 ENCOUNTER — Other Ambulatory Visit (INDEPENDENT_AMBULATORY_CARE_PROVIDER_SITE_OTHER): Payer: Self-pay

## 2020-08-27 MED ORDER — CHLORTHALIDONE 25 MG PO TABS
12.5000 mg | ORAL_TABLET | Freq: Every morning | ORAL | 1 refills | Status: AC
Start: 2020-08-27 — End: ?

## 2020-08-27 NOTE — Telephone Encounter (Signed)
Pt called nurse line said he has been without medications all weekend. So he wanted to get this refill. Pharmacy sent 3x &  he stated but no response.

## 2020-09-06 ENCOUNTER — Ambulatory Visit (INDEPENDENT_AMBULATORY_CARE_PROVIDER_SITE_OTHER): Payer: Medicare Other | Admitting: Nurse Practitioner

## 2020-09-13 ENCOUNTER — Ambulatory Visit (INDEPENDENT_AMBULATORY_CARE_PROVIDER_SITE_OTHER): Payer: Medicare Other | Admitting: Nurse Practitioner

## 2020-09-13 ENCOUNTER — Encounter (INDEPENDENT_AMBULATORY_CARE_PROVIDER_SITE_OTHER): Payer: Self-pay | Admitting: Nurse Practitioner

## 2020-09-13 ENCOUNTER — Other Ambulatory Visit: Payer: Self-pay

## 2020-09-13 ENCOUNTER — Telehealth (INDEPENDENT_AMBULATORY_CARE_PROVIDER_SITE_OTHER): Payer: Self-pay | Admitting: Nurse Practitioner

## 2020-09-13 VITALS — BP 130/70 | HR 88 | Temp 97.9°F | Ht 69.0 in | Wt 228.0 lb

## 2020-09-13 DIAGNOSIS — E559 Vitamin D deficiency, unspecified: Secondary | ICD-10-CM

## 2020-09-13 DIAGNOSIS — E782 Mixed hyperlipidemia: Secondary | ICD-10-CM | POA: Diagnosis not present

## 2020-09-13 DIAGNOSIS — R252 Cramp and spasm: Secondary | ICD-10-CM

## 2020-09-13 DIAGNOSIS — Z789 Other specified health status: Secondary | ICD-10-CM | POA: Diagnosis not present

## 2020-09-13 DIAGNOSIS — I10 Essential (primary) hypertension: Secondary | ICD-10-CM | POA: Diagnosis not present

## 2020-09-13 DIAGNOSIS — R5383 Other fatigue: Secondary | ICD-10-CM | POA: Diagnosis not present

## 2020-09-13 DIAGNOSIS — R079 Chest pain, unspecified: Secondary | ICD-10-CM

## 2020-09-13 DIAGNOSIS — N1831 Chronic kidney disease, stage 3a: Secondary | ICD-10-CM

## 2020-09-13 DIAGNOSIS — R7303 Prediabetes: Secondary | ICD-10-CM | POA: Insufficient documentation

## 2020-09-13 NOTE — Progress Notes (Signed)
Subjective:  Patient ID: Matthew Cohen, male    DOB: 1950/06/20  Age: 70 y.o. MRN: [id number]  CC:  Chief Complaint  Patient presents with  . Muscle Pain    Cramps body.  . Hyperlipidemia  . Hypertension  . Other    Vitamin D insufficiency  . Prediabetes      HPI  This patient arrives today for the above.  Cramps: He tells me he has had cramps off and on for the last couple of years, however he notices that they have gotten a bit worse over the last few weeks.  He tells me that they occur nocturnally and he will eat about a tablespoon of mustard to treat them and within 30 minutes of cramps and subside.  He tells me he tries to drink a lot of water during the day, but he is outside frequently.  He tells me when he urinates that his urine is not clear and yellow but is actually bit darker in color.  Fatigue: He tells me has been feeling very fatigued recently.  He does want to have blood work checked today to monitor his thyroid, iron levels, and testosterone.  He tells me the fatigue has gotten worse over the last couple weeks as the weather has gotten warmer and believes it could be related to the weather however he wants to make sure there is nothing else that can be contributing.  Hyperlipidemia: Last LDL was collected about 5 months ago and it was 140.  ASCVD risk or is 24.4%.  He is not currently on statin therapy and tells me he has had it in the past and did not tolerate due to negative side effects.  He tells me he has tried multiple different statins and could not tolerate any of them.  Hypertension: He continues on chlorthalidone and metoprolol.  He is tolerating medication well.  Vitamin D insufficiency: He is on 10,000 IUs of vitamin D3 daily.  Last serum check was 79.  Prediabetes: Last A1c was collected about 5 months ago and is 5.7.  Past Medical History:  Diagnosis Date  . Anxiety   . Chronic back pain   . Essential hypertension, benign   . GERD  (gastroesophageal reflux disease)   . Mixed dyslipidemia    Statin intolerance      Family History  Problem Relation Age of Onset  . Coronary artery disease Father        CABG at age 96  . Diabetes Father   . Heart disease Father   . Dementia Mother   . Healthy Daughter   . Healthy Daughter   . 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   Social History   Tobacco Use  . Smoking status: Never Smoker  . Smokeless tobacco: Never Used  Substance Use Topics  . Alcohol use: No    Comment: "Not enough to mention"     Current Meds  Medication Sig  . ASPIRIN 81 PO Take 1 tablet by mouth.  . chlorthalidone (HYGROTON) 25 MG tablet Take 0.5 tablets (12.5 mg total) by mouth every morning.  . Cholecalciferol 125 MCG (5000 UT) TABS Take 2 tablets by mouth.  . metoprolol succinate (TOPROL-XL) 25 MG 24 hr tablet Take 1 tablet (25 mg total) by mouth 2 (two) times daily.  Marland Kitchen omeprazole (PRILOSEC) 40 MG capsule Take 1 capsule (40 mg  total) by mouth daily.    ROS:  Review of Systems  Constitutional: Positive for malaise/fatigue.  Eyes: Negative for blurred vision.  Respiratory: Negative for shortness of breath.   Cardiovascular: Positive for chest pain.  Neurological: Negative for dizziness and headaches.     Objective:   Today's Vitals: BP 130/70 (BP Location: Right Arm, Patient Position: Sitting, Cuff Size: Normal)   Pulse 88   Temp 97.9 F (36.6 C) (Temporal)   Ht 5' 9" (1.753 m)   Wt 228 lb (103.4 kg)   BMI 33.67 kg/m  Vitals with BMI 09/13/2020 08/02/2020 05/10/2020  Height 5' 9" 5' 9.75" 5' 11"  Weight 228 lbs 220 lbs 10 oz 223 lbs 13 oz  BMI 33.65 10.62 69.48  Systolic 546 270 350  Diastolic 70 72 76  Pulse 88 65 59     Physical Exam Vitals reviewed.  Constitutional:      Appearance: Normal appearance.  HENT:     Head: Normocephalic and atraumatic.   Cardiovascular:     Rate and Rhythm: Normal rate and regular rhythm.  Pulmonary:     Effort: Pulmonary effort is normal.     Breath sounds: Normal breath sounds.  Musculoskeletal:     Cervical back: Neck supple.  Skin:    General: Skin is warm and dry.  Neurological:     Mental Status: He is alert and oriented to person, place, and time.  Psychiatric:        Mood and Affect: Mood normal.        Behavior: Behavior normal.        Thought Content: Thought content normal.        Judgment: Judgment normal.          Assessment and Plan   1. Cramp and spasm   2. Prediabetes   3. Fatigue, unspecified type   4. Vitamin D insufficiency   5. Stage 3a chronic kidney disease (HCC)   6. Chest pain, unspecified type   7. Statin intolerance   8. Mixed hyperlipidemia   9. Essential hypertension, benign      Plan: 1.  We will check blood work for further evaluation.  I think most likely he is getting dehydrated and I recommended he try to drink even more water than he is currently during the day to see if this will help.  He tells me he understands. 2.  We will check A1c today. 3.  We will check CMP, CBC, thyroid panel, testosterone levels for further evaluation. 4.  He will continue on his vitamin D3 supplement we will check serum level today. 5.  Per chart review I do see that he has stage IIIa chronic kidney disease.  We will recheck CMP today. 6.  He did mention that he has intermittent episodes of chest pain during his review of systems.  He tells me the last 1 happened a about a week ago when he was driving.  He tells me they do not happen very often and they usually subside on their own within a few moments.  He tells me he did undergo cardiac catheterization about 5 years ago and was told there is no blockages.  He would like to be evaluated by cardiology and possibly undergo additional testing to rule out any coronary artery disease. 7.,  8.  We did discuss his lipid panel and  his ASCVD risk score and what this represents.  I recommend he consider starting statin therapy but when he told me  he is intolerant to it I did discuss other treatment options.  For now he is can start by taking a low-dose baby aspirin we did discuss bleeding risk with this and what to do if the signs and symptoms of bleeding occur.  He may also want to consider other medication options and he may discuss this with his cardiologist when he sees him. 9.  Blood pressure acceptable on current medication regimen to continue take medications as prescribed.   Tests ordered Orders Placed This Encounter  Procedures  . CMP with eGFR([facility])  . CBC with Differential/Platelets  . TSH  . Hemoglobin A1c  . Testosterone Total,Free,Bio, Males  . T3, Free  . T4, Free  . Vitamin D, 25-hydroxy  . Ambulatory referral to Cardiology      No orders of the defined types were placed in this encounter.   Patient to follow-up in 3 months or sooner as needed.  Ailene Ards, NP

## 2020-09-13 NOTE — Patient Instructions (Signed)
PCSK9 Inhibitor - maybe look into this

## 2020-09-13 NOTE — Telephone Encounter (Signed)
Patient requesting referral to [place] if possible for his referral to cards

## 2020-09-14 LAB — TESTOSTERONE TOTAL,FREE,BIO, MALES
Albumin: 4.2 g/dL (ref 3.6–5.1)
Sex Hormone Binding: 32 nmol/L (ref 22–77)
Testosterone, Bioavailable: 106.4 ng/dL — ABNORMAL LOW (ref 110.0–575.0)
Testosterone, Free: 55.2 pg/mL (ref 46.0–224.0)
Testosterone: 403 ng/dL (ref 250–827)

## 2020-09-14 LAB — COMPLETE METABOLIC PANEL WITH GFR
AG Ratio: 1.6 (calc) (ref 1.0–2.5)
ALT: 20 U/L (ref 9–46)
AST: 23 U/L (ref 10–35)
Albumin: 4.2 g/dL (ref 3.6–5.1)
Alkaline phosphatase (APISO): 60 U/L (ref 35–144)
BUN: 18 mg/dL (ref 7–25)
CO2: 29 mmol/L (ref 20–32)
Calcium: 9.4 mg/dL (ref 8.6–10.3)
Chloride: 101 mmol/L (ref 98–110)
Creat: 1.17 mg/dL (ref 0.70–1.25)
GFR, Est African American: 73 mL/min/{1.73_m2} (ref >=60)
GFR, Est Non African American: 63 mL/min/{1.73_m2} (ref >=60)
Globulin: 2.7 g/dL (calc) (ref 1.9–3.7)
Glucose, Bld: 92 mg/dL (ref 65–139)
Potassium: 3.9 mmol/L (ref 3.5–5.3)
Sodium: 139 mmol/L (ref 135–146)
Total Bilirubin: 0.6 mg/dL (ref 0.2–1.2)
Total Protein: 6.9 g/dL (ref 6.1–8.1)

## 2020-09-14 LAB — CBC WITH DIFFERENTIAL/PLATELET
Absolute Monocytes: 731 cells/uL (ref 200–950)
Basophils Absolute: 43 cells/uL (ref 0–200)
Basophils Relative: 0.6 %
Eosinophils Absolute: 121 cells/uL (ref 15–500)
Eosinophils Relative: 1.7 %
HCT: 41.2 % (ref 38.5–50.0)
Hemoglobin: 13.8 g/dL (ref 13.2–17.1)
Lymphs Abs: 2521 cells/uL (ref 850–3900)
MCH: 30.3 pg (ref 27.0–33.0)
MCHC: 33.5 g/dL (ref 32.0–36.0)
MCV: 90.5 fL (ref 80.0–100.0)
MPV: 11.1 fL (ref 7.5–12.5)
Monocytes Relative: 10.3 %
Neutro Abs: 3685 cells/uL (ref 1500–7800)
Neutrophils Relative %: 51.9 %
Platelets: 184 10*3/uL (ref 140–400)
RBC: 4.55 10*6/uL (ref 4.20–5.80)
RDW: 13 % (ref 11.0–15.0)
Total Lymphocyte: 35.5 %
WBC: 7.1 10*3/uL (ref 3.8–10.8)

## 2020-09-14 LAB — HEMOGLOBIN A1C
Hgb A1c MFr Bld: 5.6 % of total Hgb (ref <5.7)
Mean Plasma Glucose: 114 mg/dL
eAG (mmol/L): 6.3 mmol/L

## 2020-09-14 LAB — VITAMIN D 25 HYDROXY (VIT D DEFICIENCY, FRACTURES): Vit D, 25-Hydroxy: 75 ng/mL (ref 30–100)

## 2020-09-14 LAB — T3, FREE: T3, Free: 2.8 pg/mL (ref 2.3–4.2)

## 2020-09-14 LAB — T4, FREE: Free T4: 0.9 ng/dL (ref 0.8–1.8)

## 2020-09-14 LAB — TSH: TSH: 1.45 mIU/L (ref 0.40–4.50)

## 2020-09-17 NOTE — Telephone Encounter (Signed)
Sent to heart care in [place] area

## 2020-09-19 ENCOUNTER — Other Ambulatory Visit (INDEPENDENT_AMBULATORY_CARE_PROVIDER_SITE_OTHER): Payer: Self-pay | Admitting: Internal Medicine

## 2020-09-19 ENCOUNTER — Ambulatory Visit (INDEPENDENT_AMBULATORY_CARE_PROVIDER_SITE_OTHER): Payer: Medicare Other | Admitting: Internal Medicine

## 2020-09-20 ENCOUNTER — Encounter (INDEPENDENT_AMBULATORY_CARE_PROVIDER_SITE_OTHER): Payer: Self-pay | Admitting: Internal Medicine

## 2020-09-20 ENCOUNTER — Ambulatory Visit (INDEPENDENT_AMBULATORY_CARE_PROVIDER_SITE_OTHER): Payer: Medicare Other | Admitting: Internal Medicine

## 2020-09-20 ENCOUNTER — Other Ambulatory Visit: Payer: Self-pay

## 2020-09-20 VITALS — BP 140/71 | HR 88 | Temp 97.8°F | Resp 18 | Ht 69.0 in | Wt 225.5 lb

## 2020-09-20 DIAGNOSIS — R5383 Other fatigue: Secondary | ICD-10-CM | POA: Diagnosis not present

## 2020-09-20 DIAGNOSIS — R5381 Other malaise: Secondary | ICD-10-CM

## 2020-09-20 NOTE — Progress Notes (Signed)
Metrics: Intervention Frequency ACO  Documented Smoking Status Yearly  Screened one or more times in 24 months  Cessation Counseling or  Active cessation medication Past 24 months  Past 24 months   Guideline developer: UpToDate (See UpToDate for funding source) Date Released: 2014       Wellness Office Visit  Subjective:  Patient ID: Matthew Cohen, male    DOB: 1950-11-29  Age: 70 y.o. MRN: [id number]  CC: This man comes in to discuss testosterone levels that were taken when he saw Sarah. HPI  These testosterone levels were done around 11:30 in the morning.  His total testosterone is around 400. He feels tired.  He has no other specific symptoms. Past Medical History:  Diagnosis Date   Anxiety    Chronic back pain    Essential hypertension, benign    GERD (gastroesophageal reflux disease)    Mixed dyslipidemia    Statin intolerance   Past Surgical History:  Procedure Laterality Date   LEFT HEART CATHETERIZATION WITH CORONARY ANGIOGRAM N/A 01/06/2014   Procedure: LEFT HEART CATHETERIZATION WITH CORONARY ANGIOGRAM;  Surgeon: Jettie Booze, MD;  Location: [facility] CATH LAB;  Service: Cardiovascular;  Laterality: N/A;   TOOTH EXTRACTION  05/08/2020     Family History  Problem Relation Age of Onset   Coronary artery disease Father        CABG at age 80   Diabetes Father    Heart disease Father    Dementia Mother    Healthy Daughter    Healthy Daughter    Healthy Daughter     Social History   Social History Narrative   Divorced since 2003.Lives with girlfriend for 13 years.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   Social History   Tobacco Use   Smoking status: Never   Smokeless tobacco: Never  Substance Use Topics   Alcohol use: No    Comment: "Not enough to mention"    Current Meds  Medication Sig   ASPIRIN 81 PO Take 1 tablet by mouth.   chlorthalidone (HYGROTON) 25 MG tablet Take 0.5 tablets (12.5 mg total) by mouth every morning.   Cholecalciferol 125 MCG (5000  UT) TABS Take 2 tablets by mouth.   metoprolol succinate (TOPROL-XL) 25 MG 24 hr tablet Take 1 tablet (25 mg total) by mouth 2 (two) times daily.   omeprazole (PRILOSEC) 40 MG capsule Take 1 capsule (40 mg total) by mouth daily.     [place] Office Visit from 08/02/2020 in [facility]  PHQ-9 Total Score 0       Objective:   Today's Vitals: BP 140/71 (BP Location: Right Arm, Patient Position: Sitting, Cuff Size: Normal)   Pulse 88   Temp 97.8 F (36.6 C) (Temporal)   Resp 18   Ht 5\' 9"  (1.753 m)   Wt 225 lb 8 oz (102.3 kg)   SpO2 98%   BMI 33.30 kg/m  Vitals with BMI 09/20/2020 09/13/2020 08/02/2020  Height 5\' 9"  5\' 9"  5' 9.75"  Weight 225 lbs 8 oz 228 lbs 220 lbs 10 oz  BMI 33.29 56.43 32.95  Systolic 188 416 606  Diastolic 71 70 72  Pulse 88 88 65     Physical Exam He looks systemically well.  He is obese.      Assessment   1. Malaise and fatigue       Tests ordered No orders of the defined types were placed in this encounter.    Plan: 1.  I discussed testosterone levels and  testosterone level requirements to qualify for hypogonadism.  I also discussed that testosterone therapy is a lifelong therapy. I discussed the FDA warnings, benefits and side effects and mode of administration. He will think about it and let us know whether he wants to proceed or not.  I spent 30 minutes with this patient discussing testosterone therapy and the evidence behind its benefits.   No orders of the defined types were placed in this encounter.   Doree Albee, MD

## 2020-10-02 ENCOUNTER — Ambulatory Visit (INDEPENDENT_AMBULATORY_CARE_PROVIDER_SITE_OTHER): Payer: Medicare Other | Admitting: Internal Medicine

## 2020-10-02 ENCOUNTER — Encounter (INDEPENDENT_AMBULATORY_CARE_PROVIDER_SITE_OTHER): Payer: Self-pay | Admitting: Internal Medicine

## 2020-10-02 ENCOUNTER — Other Ambulatory Visit: Payer: Self-pay

## 2020-10-02 ENCOUNTER — Telehealth (INDEPENDENT_AMBULATORY_CARE_PROVIDER_SITE_OTHER): Payer: Self-pay

## 2020-10-02 VITALS — BP 130/80 | HR 69 | Resp 18 | Ht 69.0 in | Wt 222.2 lb

## 2020-10-02 DIAGNOSIS — R079 Chest pain, unspecified: Secondary | ICD-10-CM

## 2020-10-02 NOTE — Telephone Encounter (Signed)
Patient called and stated that he was at [employer] and when he got out of the truck he had a very sharp chest pain that went across his chest. Patient denied any pain, numbness, or heaviness in his arms or legs, no vision changes, no SOB, and no fatigue. Patient stated that it did not last long and he took 2 Aspirin 81 mg that he got at [employer].   I advised Dr. Anastasio Champion and put him on the schedule at 5pm today. Patient verbalized an understanding and will be here earlier.

## 2020-10-02 NOTE — Progress Notes (Signed)
Getting gas at [facility] , and felt a sharpe pain . Then went into store and got ASA take 2 instantly. And went home afterward. Noon today when this happen.

## 2020-10-02 NOTE — Progress Notes (Signed)
Metrics: Intervention Frequency ACO  Documented Smoking Status Yearly  Screened one or more times in 24 months  Cessation Counseling or  Active cessation medication Past 24 months  Past 24 months   Guideline developer: UpToDate (See UpToDate for funding source) Date Released: 2014       Wellness Office Visit  Subjective:  Patient ID: Matthew Cohen, male    DOB: [birth year]  Age: 70 y.o. MRN: [id number]  CC: Chest pain HPI  This man comes in for an acute visit with an episode of chest pain he had around 12 noon today.  He was at a gas station pumping the gas into his car when he had a sudden onset of sharp chest pain across his chest.  According to him, the pain lasted less than 15 to 20 seconds and then was gone.  It left him with some soreness in the chest which she has to some degree even now, 5 hours later.  The pain was not associated with nausea, dyspnea, sweating.  He says that he has had this exact pain in the past and it has been investigated.  In 2015 he had cardiac catheterization which showed mild atherosclerosis in the mid left anterior descending artery but no other major abnormalities. Past Medical History:  Diagnosis Date   Anxiety    Chronic back pain    Essential hypertension, benign    GERD (gastroesophageal reflux disease)    Mixed dyslipidemia    Statin intolerance   Past Surgical History:  Procedure Laterality Date   LEFT HEART CATHETERIZATION WITH CORONARY ANGIOGRAM N/A 01/06/2014   Procedure: LEFT HEART CATHETERIZATION WITH CORONARY ANGIOGRAM;  Surgeon: Jettie Booze, MD;  Location: [facility] CATH LAB;  Service: Cardiovascular;  Laterality: N/A;   TOOTH EXTRACTION  05/08/2020     Family History  Problem Relation Age of Onset   Coronary artery disease Father        CABG at age 71   Diabetes Father    Heart disease Father    Dementia Mother    Healthy Daughter    Healthy Daughter    Healthy Daughter     Social History   Social History Narrative    Divorced since 2003.Lives with girlfriend for 13 years.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   Social History   Tobacco Use   Smoking status: Never   Smokeless tobacco: Never  Substance Use Topics   Alcohol use: No    Comment: "Not enough to mention"    Current Meds  Medication Sig   ASPIRIN 81 PO Take 1 tablet by mouth.   chlorthalidone (HYGROTON) 25 MG tablet Take 0.5 tablets (12.5 mg total) by mouth every morning.   Cholecalciferol 125 MCG (5000 UT) TABS Take 2 tablets by mouth.   metoprolol succinate (TOPROL-XL) 25 MG 24 hr tablet Take 1 tablet (25 mg total) by mouth 2 (two) times daily.   omeprazole (PRILOSEC) 40 MG capsule Take 1 capsule (40 mg total) by mouth daily.     [place] Office Visit from 08/02/2020 in [facility]  PHQ-9 Total Score 0       Objective:   Today's Vitals: BP 130/80 (BP Location: Right Arm, Patient Position: Sitting, Cuff Size: Normal)   Pulse 69   Resp 18   Ht 5\' 9"  (1.753 m)   Wt 222 lb 3.2 oz (100.8 kg)   SpO2 97%   BMI 32.81 kg/m  Vitals with BMI 10/02/2020 09/20/2020 09/13/2020  Height 5\' 9"  5\' 9"  5\' 9"   Weight 222 lbs 3 oz 225 lbs 8 oz 228 lbs  BMI 32.8 52.17 47.15  Systolic 953 967 289  Diastolic 80 71 70  Pulse 69 88 88     Physical Exam   He does not appear to have any pain at rest.  Heart sounds are present without pericardial rub.  There is no gallop rhythm.  Lung fields are clear without pleural rub.  There is not appear to be any significant anterior chest wall tenderness either.    Assessment   1. Chest pain, unspecified type       Tests ordered Orders Placed This Encounter  Procedures   EKG 12-Lead     Plan: 1.  An ECG done in the office shows normal sinus rhythm without any acute ST-T wave changes. 2.  I think, overall, this chest pain does not appear to be cardiac in origin.  I have told the patient that should the pain recur and last longer, he should go to the emergency room.  He already has an  appointment to see cardiology in early August.    No orders of the defined types were placed in this encounter.   Doree Albee, MD

## 2020-10-11 ENCOUNTER — Ambulatory Visit (INDEPENDENT_AMBULATORY_CARE_PROVIDER_SITE_OTHER): Payer: Medicare Other | Admitting: Cardiology

## 2020-10-11 ENCOUNTER — Encounter: Payer: Self-pay | Admitting: Cardiology

## 2020-10-11 ENCOUNTER — Other Ambulatory Visit: Payer: Self-pay

## 2020-10-11 VITALS — BP 142/84 | HR 56 | Ht 71.0 in | Wt 226.8 lb

## 2020-10-11 DIAGNOSIS — I25119 Atherosclerotic heart disease of native coronary artery with unspecified angina pectoris: Secondary | ICD-10-CM

## 2020-10-11 DIAGNOSIS — E782 Mixed hyperlipidemia: Secondary | ICD-10-CM | POA: Diagnosis not present

## 2020-10-11 DIAGNOSIS — T466X5A Adverse effect of antihyperlipidemic and antiarteriosclerotic drugs, initial encounter: Secondary | ICD-10-CM | POA: Diagnosis not present

## 2020-10-11 DIAGNOSIS — M791 Myalgia, unspecified site: Secondary | ICD-10-CM

## 2020-10-11 DIAGNOSIS — R079 Chest pain, unspecified: Secondary | ICD-10-CM | POA: Diagnosis not present

## 2020-10-11 NOTE — Patient Instructions (Signed)
Medication Instructions:  Your physician recommends that you continue on your current medications as directed. Please refer to the Current Medication list given to you today.  *If you need a refill on your cardiac medications before your next appointment, please call your pharmacy*   Lab Work: None today  If you have labs (blood work) drawn today and your tests are completely normal, you will receive your results only by: [place] (if you have MyChart) OR A paper copy in the mail If you have any lab test that is abnormal or we need to change your treatment, we will call you to review the results.   Testing/Procedures: Your physician has requested that you have en exercise stress myoview. For further information please visit [email or web address]. Please follow instruction sheet, as given. HOLD TOPROL THE EVENING BEFORE AND THE MORNING OF TEST   Follow-Up: At [facility], you and your health needs are our priority.  As part of our continuing mission to provide you with exceptional heart care, we have created designated Provider Care Teams.  These Care Teams include your primary Cardiologist (physician) and Advanced Practice Providers (APPs -  Physician Assistants and Nurse Practitioners) who all work together to provide you with the care you need, when you need it.  We recommend signing up for the patient portal called "MyChart".  Sign up information is provided on this After Visit Summary.  MyChart is used to connect with patients for Virtual Visits (Telemedicine).  Patients are able to view lab/test results, encounter notes, upcoming appointments, etc.  Non-urgent messages can be sent to your provider as well.   To learn more about what you can do with MyChart, go to [email or web address].    Your next appointment:  We will call you with test results.

## 2020-10-11 NOTE — Progress Notes (Signed)
Cardiology Office Note  Date: 10/11/2020   ID: Matthew Cohen, DOB 1950/09/18, MRN [id number]  PCP:  Doree Albee, MD  Cardiologist:  Rozann Lesches, MD Electrophysiologist:  None   Chief Complaint  Patient presents with   Chest discomfort     History of Present Illness: Matthew Cohen is a 70 y.o. male referred for cardiology consultation by Dr. Anastasio Champion for evaluation of chest discomfort.  He describes the occurrence of a very sharp discomfort in his chest about a week ago when he was at a gas station, no major associated exertion at the time.  Took an aspirin and symptoms ultimately resolved, but for the next few days he felt sore in his chest.  This has happened sporadically.  He underwent a diagnostic cardiac catheterization in 2015 that demonstrated only mild atherosclerosis within the LAD, no flow-limiting stenoses.  He has not undergone follow-up ischemic testing since then.  He has a history of statin intolerance, also generally prefers not to take any medications for lipid lowering.  His LDL was 140 in January.  I did talk with him about other possible medication options.  He states that he is careful with his diet.  I reviewed his recent ECG as noted below.  We also went over his medications.  Past Medical History:  Diagnosis Date   Anxiety    Chronic back pain    Essential hypertension, benign    GERD (gastroesophageal reflux disease)    Mixed dyslipidemia    Statin intolerance    Past Surgical History:  Procedure Laterality Date   LEFT HEART CATHETERIZATION WITH CORONARY ANGIOGRAM N/A 01/06/2014   Procedure: LEFT HEART CATHETERIZATION WITH CORONARY ANGIOGRAM;  Surgeon: Jettie Booze, MD;  Location: [facility] CATH LAB;  Service: Cardiovascular;  Laterality: N/A;   TOOTH EXTRACTION  05/08/2020    Current Outpatient Medications  Medication Sig Dispense Refill   chlorthalidone (HYGROTON) 25 MG tablet Take 0.5 tablets (12.5 mg total) by mouth every morning. 45  tablet 1   Cholecalciferol 125 MCG (5000 UT) TABS Take 2 tablets by mouth.     metoprolol succinate (TOPROL-XL) 25 MG 24 hr tablet Take 1 tablet (25 mg total) by mouth 2 (two) times daily. 180 tablet 1   omeprazole (PRILOSEC) 40 MG capsule Take 1 capsule (40 mg total) by mouth daily. 90 capsule 1   No current facility-administered medications for this visit.   Allergies:  Patient has no known allergies.   Social History: The patient  reports that he has never smoked. He has never used smokeless tobacco. He reports that he does not drink alcohol and does not use drugs.   Family History: The patient's family history includes Coronary artery disease in his father; Dementia in his mother; Diabetes in his father; Healthy in his daughter, daughter, and daughter; Heart disease in his father.   ROS: No palpitations or syncope.  Physical Exam: VS:  BP (!) 142/84 (BP Location: Left Arm)   Pulse (!) 56   Ht 5\' 11"  (1.803 m)   Wt 226 lb 12.8 oz (102.9 kg)   SpO2 98%   BMI 31.63 kg/m , BMI Body mass index is 31.63 kg/m.  Wt Readings from Last 3 Encounters:  10/11/20 226 lb 12.8 oz (102.9 kg)  10/02/20 222 lb 3.2 oz (100.8 kg)  09/20/20 225 lb 8 oz (102.3 kg)    General: Patient appears comfortable at rest. HEENT: Conjunctiva and lids normal, wearing a mask. Neck: Supple, no elevated JVP  or carotid bruits, no thyromegaly. Lungs: Clear to auscultation, nonlabored breathing at rest. Cardiac: Regular rate and rhythm, no S3 or significant systolic murmur, no pericardial rub. Abdomen: Soft, nontender, bowel sounds present. Extremities: No pitting edema, distal pulses 2+. Skin: Warm and dry. Musculoskeletal: No kyphosis. Neuropsychiatric: Alert and oriented x3, affect grossly appropriate.  ECG:  An ECG dated 10/02/2020 was personally reviewed today and demonstrated:  Sinus rhythm with IVCD and anteroseptal Q waves.  Recent Labwork: 09/13/2020: ALT 20; AST 23; BUN 18; Creat 1.17; Hemoglobin 13.8;  Platelets 184; Potassium 3.9; Sodium 139; TSH 1.45     Component Value Date/Time   CHOL 204 (H) 05/10/2020 1329   TRIG 250 (H) 05/10/2020 1329   HDL 26 (L) 05/10/2020 1329   CHOLHDL 7.8 (H) 05/10/2020 1329   LDLCALC 140 (H) 05/10/2020 1329    Other Studies Reviewed Today:  Cardiac catheterization 01/06/2014: HEMODYNAMICS:  Aortic pressure was 118/66; LV pressure was 116/2; LVEDP 7.  There was no gradient between the left ventricle and aorta.     ANGIOGRAPHIC DATA:   The left main coronary artery is widely patent.   The left anterior descending artery is a large vessel which wraps around the apex. There is a large first diagonal which is widely patent. There is mild eccentric atherosclerosis just after the first diagonal. The remainder of the mid to distal LAD is widely patent.   The left circumflex artery is a large vessel. The ramus vessel is he medium size and patent. The OM1 is medium size and widely patent. The OM 2 is small but patent. There is a large OM 3 which is widely patent.   The right coronary artery is a medium size, dominant vessel. The posterior lateral artery and posterior descending artery are widely patent.  There is no obstructive disease in the RCA system.   Right upper extremity angiogram: There is tortuosity in the right subclavian area but no significant obstruction.   LEFT VENTRICULOGRAM:  Left ventricular angiogram was done in the 30 RAO projection and revealed normal left ventricular wall motion and systolic function with an estimated ejection fraction of 55%.  LVEDP was 7 mmHg.   IMPRESSIONS:   Normal left main coronary artery.  Mild atherosclerosis in the mid  left anterior descending artery  without significant obstruction.  Widely patent  left circumflex artery and its branches.  Widely patent right coronary artery. Normal left ventricular systolic function.  LVEDP 7 mmHg.  Ejection fraction 55%.  Assessment and Plan:  1.  Recurrent atypical chest  pain in a 70 year old male with history of previously documented mild LAD atherosclerosis as of 2015, LDL 140 with statin intolerance, family history of CAD in his father, and also hypertension.  I reviewed his recent ECG.  We will plan on follow-up ischemic testing via exercise Myoview, plan to hold evening prior and morning dose of Toprol-XL.  2.  Essential hypertension, on Toprol-XL on chlorthalidone with follow-up by Dr. Anastasio Champion.  Medication Adjustments/Labs and Tests Ordered: Current medicines are reviewed at length with the patient today.  Concerns regarding medicines are outlined above.   Tests Ordered: Orders Placed This Encounter  Procedures   NM Myocar Multi W/Spect W/Wall Motion / EF     Medication Changes: No orders of the defined types were placed in this encounter.   Disposition:  Follow up  test results.  Signed, Satira Sark, MD, [facility] 10/11/2020 1:52 PM    [facility] at [facility] [address], [place], [place] [address] Phone: [phone]; Fax: [phone]

## 2020-10-25 ENCOUNTER — Encounter (HOSPITAL_COMMUNITY): Payer: Self-pay

## 2020-10-25 ENCOUNTER — Ambulatory Visit (HOSPITAL_COMMUNITY)
Admission: RE | Admit: 2020-10-25 | Discharge: 2020-10-25 | Disposition: A | Discharge status: Home / Self Care | Payer: Medicare Other | Source: Ambulatory Visit | Attending: Cardiology | Admitting: Cardiology

## 2020-10-25 ENCOUNTER — Encounter (HOSPITAL_COMMUNITY)
Admission: RE | Admit: 2020-10-25 | Discharge: 2020-10-25 | Disposition: A | Discharge status: Home / Self Care | Payer: Medicare Other | Source: Ambulatory Visit | Attending: Cardiology | Admitting: Cardiology

## 2020-10-25 ENCOUNTER — Other Ambulatory Visit: Payer: Self-pay

## 2020-10-25 DIAGNOSIS — R079 Chest pain, unspecified: Secondary | ICD-10-CM | POA: Diagnosis not present

## 2020-10-25 MED ORDER — TECHNETIUM TC 99M TETROFOSMIN IV KIT
30.0000 | PACK | Freq: Once | INTRAVENOUS | Status: AC | PRN
  Administered 2020-10-25: 31 via INTRAVENOUS

## 2020-10-25 MED ORDER — SODIUM CHLORIDE FLUSH 0.9 % IV SOLN
INTRAVENOUS | Status: AC
  Administered 2020-10-25: 10 mL via INTRAVENOUS
  Filled 2020-10-25: qty 10

## 2020-10-25 MED ORDER — TECHNETIUM TC 99M TETROFOSMIN IV KIT
10.0000 | PACK | Freq: Once | INTRAVENOUS | Status: AC | PRN
  Administered 2020-10-25: 10.8 via INTRAVENOUS

## 2020-10-25 MED ORDER — REGADENOSON 0.4 MG/5ML IV SOLN
INTRAVENOUS | Status: AC
  Filled 2020-10-25: qty 5

## 2020-10-26 LAB — NM MYOCAR MULTI W/SPECT W/WALL MOTION / EF
Estimated workload: 7 METS
Exercise duration (min): 5 min
Exercise duration (sec): 17 s
LV dias vol: 115 mL (ref 62–150)
LV sys vol: 45 mL
MPHR: 151 {beats}/min
Peak HR: 133 {beats}/min
Percent HR: 88 %
RATE: 0.37
RPE: 12
Rest HR: 56 {beats}/min
SDS: 0
SRS: 3
SSS: 3
TID: 1.05

## 2020-10-29 ENCOUNTER — Telehealth: Payer: Self-pay

## 2020-10-29 DIAGNOSIS — I200 Unstable angina: Secondary | ICD-10-CM

## 2020-10-29 DIAGNOSIS — R9430 Abnormal result of cardiovascular function study, unspecified: Secondary | ICD-10-CM

## 2020-10-29 NOTE — Telephone Encounter (Signed)
-----   Message from Satira Sark, MD sent at 10/26/2020  7:33 PM EDT ----- Results reviewed.  I also reviewed the study images.  Myoview interpreted as intermediate risk suggesting inferior infarct scar and moderate peri-infarct ischemia.  This could be related to soft tissue attenuation however, LVEF is normal without wall motion abnormalities, and there were no ischemic ST segment changes during stress.  Symptoms were atypical as discussed in the recent consultation.  We do need to clarify this in terms of evaluation of his coronary anatomy.  One option would be to proceed to a diagnostic cardiac catheterization, although if he would like to avoid invasive testing, we could get him scheduled for a coronary CTA.  He had only mild atherosclerosis in 2015.

## 2020-10-29 NOTE — Telephone Encounter (Signed)
I discussed results with patient. He wishes to have cardiac CTA. I have placed order, await insurance approval.

## 2020-10-31 ENCOUNTER — Telehealth: Payer: Self-pay | Admitting: Cardiology

## 2020-10-31 DIAGNOSIS — Z01818 Encounter for other preprocedural examination: Secondary | ICD-10-CM

## 2020-10-31 MED ORDER — METOPROLOL TARTRATE 25 MG PO TABS: ORAL_TABLET | ORAL | 0 refills | Status: DC

## 2020-10-31 NOTE — Telephone Encounter (Signed)
Lorenza Evangelist, RN  Bernita Raisin, RN I would prescribe at least 25mg  metoprolol tartrate one time dose but its likely hes going to be okay with daily toprol xl   When I call him to review instructions I can see what his HR is if checks it periodically at home or wears a fitbit , etc.     Clarise Cruz         Previous Messages    ----- Message -----  From: Bernita Raisin, RN  Sent: 10/31/2020   3:24 PM EDT  To: Lorenza Evangelist, RN  Subject: FW: lopressor dose for cardiac CT               Hi Clarise Cruz, what is your advice regarding lopressor dosing ?  ----- Message -----  From: Satira Sark, MD  Sent: 10/31/2020   3:07 PM EDT  To: Bernita Raisin, RN  Subject: RE: lopressor dose for cardiac CT               Isn't there a protocol for this based on reported heart rate?  It sounds like he may not need any if his resting heart rate is in the 50s on current dose of Toprol-XL.  You might reach out to the CT team.  ----- Message -----  From: Bernita Raisin, RN  Sent: 10/31/2020   2:50 PM EDT  To: Satira Sark, MD  Subject: lopressor dose for cardiac CT                   Cardiac CT tomorrow, HR in office was 56. Patient takes toprol 25 mg bid.   What dose of lopressor do you want him to take 2 hrs before procedure?

## 2020-10-31 NOTE — Telephone Encounter (Signed)
I spoke with patient and he will monitor heart rate at home. I also e-scribed Lopressor 25 mg if needed for his CT. Marchia Bond, RN, Nurse Navigator-Cardiac Imaging will speak with patient prior to test and decide if patient needs to pick up the Lopressor.I sent Cardiac Ct instructions to patient through [place].

## 2020-10-31 NOTE — Telephone Encounter (Signed)
BMET order placed. I await Dr.McDowell's instructions for lopressor dose.

## 2020-10-31 NOTE — Telephone Encounter (Signed)
CT at [facility] called stating pt is needing to have lab work done prior to his CT that Dr. Domenic Polite ordered. They're going to tell pt to go to [facility] tomorrow to have lab work done.

## 2020-11-01 NOTE — Telephone Encounter (Signed)
CT scheduled, instructions given to patient.

## 2020-11-05 ENCOUNTER — Encounter (HOSPITAL_COMMUNITY): Payer: Self-pay

## 2020-11-05 ENCOUNTER — Telehealth (HOSPITAL_COMMUNITY): Payer: Self-pay | Admitting: *Deleted

## 2020-11-05 NOTE — Telephone Encounter (Signed)
Received a phone call from patient regarding Cardiac CT scan this Thursday.  Patient asking about labs and briefly answered questions about medications.  Instructions sent to patient via MyChart and informed patient he would get another call closer to appointment to make sure he understood instructions.  Patient verbalized understanding.  Patient had name and call  back number should he has other question in the interim.   Gordy Clement, RN navigator, cardiac imaging [phone]

## 2020-11-06 ENCOUNTER — Other Ambulatory Visit (HOSPITAL_COMMUNITY): Payer: Self-pay | Admitting: *Deleted

## 2020-11-06 ENCOUNTER — Other Ambulatory Visit (HOSPITAL_COMMUNITY)
Admission: RE | Admit: 2020-11-06 | Discharge: 2020-11-06 | Disposition: A | Discharge status: Home / Self Care | Payer: Medicare Other | Source: Ambulatory Visit | Attending: Cardiology | Admitting: Cardiology

## 2020-11-06 ENCOUNTER — Other Ambulatory Visit: Payer: Self-pay

## 2020-11-06 ENCOUNTER — Telehealth (HOSPITAL_COMMUNITY): Payer: Self-pay | Admitting: *Deleted

## 2020-11-06 DIAGNOSIS — Z01818 Encounter for other preprocedural examination: Secondary | ICD-10-CM

## 2020-11-06 DIAGNOSIS — Z01812 Encounter for preprocedural laboratory examination: Secondary | ICD-10-CM | POA: Insufficient documentation

## 2020-11-06 LAB — BASIC METABOLIC PANEL
Anion gap: 7 (ref 5–15)
BUN: 20 mg/dL (ref 8–23)
CO2: 27 mmol/L (ref 22–32)
Calcium: 9 mg/dL (ref 8.9–10.3)
Chloride: 100 mmol/L (ref 98–111)
Creatinine, Ser: 1.29 mg/dL — ABNORMAL HIGH (ref 0.61–1.24)
GFR, Estimated: >60 mL/min (ref >60)
Glucose, Bld: 96 mg/dL (ref 70–99)
Potassium: 3.8 mmol/L (ref 3.5–5.1)
Sodium: 134 mmol/L — ABNORMAL LOW (ref 135–145)

## 2020-11-06 NOTE — Telephone Encounter (Signed)
Reaching out to patient to offer assistance regarding upcoming cardiac imaging study; pt verbalizes understanding of appt date/time, parking situation and where to check in, pre-test NPO status and medications ordered, and verified current allergies; name and call back number provided for further questions should they arise  Rayshard Schirtzinger RN Navigator Cardiac Imaging [facility] [phone] office [phone] cell  

## 2020-11-06 NOTE — Progress Notes (Signed)
Pre-op testing BMET

## 2020-11-08 ENCOUNTER — Telehealth: Payer: Self-pay | Admitting: Cardiology

## 2020-11-08 ENCOUNTER — Other Ambulatory Visit: Payer: Self-pay

## 2020-11-08 ENCOUNTER — Ambulatory Visit (HOSPITAL_COMMUNITY)
Admission: RE | Admit: 2020-11-08 | Discharge: 2020-11-08 | Disposition: A | Discharge status: Home / Self Care | Payer: Medicare Other | Source: Ambulatory Visit | Attending: Cardiology | Admitting: Cardiology

## 2020-11-08 DIAGNOSIS — R9430 Abnormal result of cardiovascular function study, unspecified: Secondary | ICD-10-CM | POA: Insufficient documentation

## 2020-11-08 DIAGNOSIS — I200 Unstable angina: Secondary | ICD-10-CM | POA: Insufficient documentation

## 2020-11-08 MED ORDER — NITROGLYCERIN 0.4 MG SL SUBL
SUBLINGUAL_TABLET | SUBLINGUAL | Status: AC
  Filled 2020-11-08: qty 2

## 2020-11-08 MED ORDER — IOHEXOL 350 MG/ML SOLN
100.0000 mL | Freq: Once | INTRAVENOUS | Status: AC | PRN
  Administered 2020-11-08: 100 mL via INTRAVENOUS

## 2020-11-08 MED ORDER — NITROGLYCERIN 0.4 MG SL SUBL
0.8000 mg | SUBLINGUAL_TABLET | Freq: Once | SUBLINGUAL | Status: AC
  Administered 2020-11-08: 0.8 mg via SUBLINGUAL

## 2020-11-08 NOTE — Telephone Encounter (Signed)
Patient returning call to [facility] for results for Cardiac ct

## 2020-11-08 NOTE — Telephone Encounter (Signed)
Results discussed with patient.He will call back if he wants to start zetia or go to lipid clinic.

## 2020-11-13 ENCOUNTER — Ambulatory Visit: Payer: Medicare Other | Admitting: Cardiology

## 2020-12-12 ENCOUNTER — Ambulatory Visit (INDEPENDENT_AMBULATORY_CARE_PROVIDER_SITE_OTHER): Payer: Medicare Other | Admitting: Nurse Practitioner

## 2021-01-28 DIAGNOSIS — I498 Other specified cardiac arrhythmias: Secondary | ICD-10-CM | POA: Diagnosis not present

## 2021-01-28 DIAGNOSIS — I10 Essential (primary) hypertension: Secondary | ICD-10-CM | POA: Diagnosis not present

## 2021-01-28 DIAGNOSIS — K219 Gastro-esophageal reflux disease without esophagitis: Secondary | ICD-10-CM | POA: Diagnosis not present

## 2021-01-28 DIAGNOSIS — Z6831 Body mass index (BMI) 31.0-31.9, adult: Secondary | ICD-10-CM | POA: Diagnosis not present

## 2021-01-29 ENCOUNTER — Telehealth: Payer: Self-pay | Admitting: Cardiology

## 2021-01-29 NOTE — Telephone Encounter (Signed)
Lmtcb -ab

## 2021-01-29 NOTE — Telephone Encounter (Signed)
Received last office visit note from [place] - pt saw Launa Grill, PA - was told to f/u with Dr. Domenic Polite due to BP readings.   Please let me know if pt needs to be seen

## 2021-01-29 NOTE — Telephone Encounter (Signed)
Spoke with pt who states he was seen by his PCP and an EKG done. Pt states that his PCP wants to make some changes to his meds. Patient wants Dr. Tomie China to review and make changes.

## 2021-01-30 NOTE — Telephone Encounter (Signed)
Appt made for Katina Dung, NP on tomorrow at 11:00 am.

## 2021-01-30 NOTE — Progress Notes (Signed)
Cardiology Office Note  Date: 01/31/2021   ID: Matthew Cohen, DOB Jun 04, 1950, MRN [id number]  PCP:  Wanita Chamberlain, PA-C  Cardiologist:  Rozann Lesches, MD Electrophysiologist:  None   Chief Complaint: Medication management  History of Present Illness: Matthew Cohen is a 70 y.o. male with a history of HTN, stage III kidney disease, HLD, precordial pain, prediabetes, statin intolerance.  Chronic back pain, GERD.  He was last seen by Dr. Domenic Polite on 10/11/2020 for recurrent chest pain, history of CAD, HLD, myalgia due to statin.  He was being referred by Dr. Anastasio Champion for evaluation of chest discomfort.  He had chest pain a week prior with no major associated exertion.  He took an aspirin and symptoms resolved.  Previous cardiac catheterization 2015: Mild atherosclerosis LAD.  No flow-limiting stenosis.  No ischemic testing since that time.  He was not taking any lipid-lowering medication HDL was 140 in January.  Other medication options were discussed.  Plan was for follow-up ischemic testing via exercise Myoview.  He was continuing Toprol-XL and chlorthalidone for hypertension.  Lexiscan Myoview was interpreted as intermediate risk suggesting inferior infarct scar and moderate peri-infarct ischemia.  Symptoms were atypical.  Dr. Domenic Polite discussed 1 option would be to proceed to a diagnostic cardiac catheterization.  If he would like to avoid invasive testing could plan for coronary CTA.  He had only mild atherosclerosis in 2015.  Coronary CTA was reassuring with calcium score of only 6.  Could consider Zetia or PCSK9 inhibitor.  Possible evaluation at lipid clinic in [place].  He is here after being seen by his primary care provider who noticed a slow heart rate with PVCs in a bigeminal pattern.  She had him decrease his metoprolol from 25 mg p.o. twice daily down to 12.5 mg p.o. twice daily.  He states he was asymptomatic.  Repeat EKG today shows sinus bradycardia with a rate of 55.   However blood pressure is elevated today.  He is concerned about his blood pressure being too high since decreasing the medication.  He would prefer to go back on metoprolol at previous dose since he was asymptomatic.  He denies any anginal or exertional symptoms, lightheadedness, dizziness, presyncope or syncope.  Denies any PND, orthopnea, palpitations or arrhythmias.  Past Medical History:  Diagnosis Date   Anxiety    Chronic back pain    Essential hypertension, benign    GERD (gastroesophageal reflux disease)    Mixed dyslipidemia    Statin intolerance    Past Surgical History:  Procedure Laterality Date   LEFT HEART CATHETERIZATION WITH CORONARY ANGIOGRAM N/A 01/06/2014   Procedure: LEFT HEART CATHETERIZATION WITH CORONARY ANGIOGRAM;  Surgeon: Jettie Booze, MD;  Location: [facility] CATH LAB;  Service: Cardiovascular;  Laterality: N/A;   TOOTH EXTRACTION  05/08/2020    Current Outpatient Medications  Medication Sig Dispense Refill   chlorthalidone (HYGROTON) 25 MG tablet Take 0.5 tablets (12.5 mg total) by mouth every morning. 45 tablet 1   Cholecalciferol 125 MCG (5000 UT) TABS Take 2 tablets by mouth.     metoprolol succinate (TOPROL-XL) 25 MG 24 hr tablet Take 1 tablet (25 mg total) by mouth 2 (two) times daily. (Patient taking differently: Take 12.5 mg by mouth 2 (two) times daily.) 180 tablet 1   omeprazole (PRILOSEC) 40 MG capsule Take 1 capsule (40 mg total) by mouth daily. 90 capsule 1   No current facility-administered medications for this visit.   Allergies:  Patient has no  known allergies.   Social History: The patient  reports that he has never smoked. He has never used smokeless tobacco. He reports that he does not drink alcohol and does not use drugs.   Family History: The patient's family history includes Coronary artery disease in his father; Dementia in his mother; Diabetes in his father; Healthy in his daughter, daughter, and daughter; Heart disease in his  father.   ROS:  Please see the history of present illness. Otherwise, complete review of systems is positive for none.  All other systems are reviewed and negative.   Physical Exam: VS:  BP 140/84   Pulse (!) 58   Ht 5\' 11"  (1.803 m)   Wt 229 lb (103.9 kg)   SpO2 98%   BMI 31.94 kg/m , BMI Body mass index is 31.94 kg/m.  Wt Readings from Last 3 Encounters:  01/31/21 229 lb (103.9 kg)  10/11/20 226 lb 12.8 oz (102.9 kg)  10/02/20 222 lb 3.2 oz (100.8 kg)    General: Patient appears comfortable at rest. Neck: Supple, no elevated JVP or carotid bruits, no thyromegaly. Lungs: Clear to auscultation, nonlabored breathing at rest. Cardiac: Regular rate and rhythm, no S3 or significant systolic murmur, no pericardial rub. Extremities: No pitting edema, distal pulses 2+. Skin: Warm and dry. Musculoskeletal: No kyphosis. Neuropsychiatric: Alert and oriented x3, affect grossly appropriate.  ECG: Sinus bradycardia with a rate of 55, left axis deviation  Recent Labwork: 09/13/2020: ALT 20; AST 23; Hemoglobin 13.8; Platelets 184; TSH 1.45 11/06/2020: BUN 20; Creatinine, Ser 1.29; Potassium 3.8; Sodium 134     Component Value Date/Time   CHOL 204 (H) 05/10/2020 1329   TRIG 250 (H) 05/10/2020 1329   HDL 26 (L) 05/10/2020 1329   CHOLHDL 7.8 (H) 05/10/2020 1329   LDLCALC 140 (H) 05/10/2020 1329    Other Studies Reviewed Today:   Coronary CTA 11/08/2020 IMPRESSION: 1. Coronary calcium score of 6. This was 20th percentile for age, sex, and race matched control.   2. Normal coronary origin with right dominance.   3. Aortic valve calcium score 281- mild.   4. CAD-RADS 2. Mild non-obstructive CAD (25-49%). Consider non-atherosclerotic causes of chest pain. Consider preventive therapy and risk factor modification.   5. Cannot exclude small PFO   Lexiscan Myoview 10/25/2020 Study Result  Narrative & Impression  Blood pressure demonstrated a hypertensive response to  exercise. 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 This is an intermediate risk study. The left ventricular ejection fraction is normal (55-65%). There was no ST segment deviation noted during stress.        Cardiac catheterization 01/06/2014: HEMODYNAMICS:  Aortic pressure was 118/66; LV pressure was 116/2; LVEDP 7.  There was no gradient between the left ventricle and aorta.     ANGIOGRAPHIC DATA:   The left main coronary artery is widely patent.   The left anterior descending artery is a large vessel which wraps around the apex. There is a large first diagonal which is widely patent. There is mild eccentric atherosclerosis just after the first diagonal. The remainder of the mid to distal LAD is widely patent.   The left circumflex artery is a large vessel. The ramus vessel is he medium size and patent. The OM1 is medium size and widely patent. The OM 2 is small but patent. There is a large OM 3 which is widely patent.   The right coronary artery is a medium size, dominant vessel. The posterior lateral  artery and posterior descending artery are widely patent.  There is no obstructive disease in the RCA system.   Right upper extremity angiogram: There is tortuosity in the right subclavian area but no significant obstruction.   LEFT VENTRICULOGRAM:  Left ventricular angiogram was done in the 30 RAO projection and revealed normal left ventricular wall motion and systolic function with an estimated ejection fraction of 55%.  LVEDP was 7 mmHg.   IMPRESSIONS:   Normal left main coronary artery.  Mild atherosclerosis in the mid  left anterior descending artery  without significant obstruction.  Widely patent  left circumflex artery and its branches.  Widely patent right coronary artery. Normal left ventricular systolic function.  LVEDP 7 mmHg.  Ejection fraction 55%.    Assessment and Plan:  1. Recurrent  chest pain   2. Essential hypertension, benign   3. Mixed hyperlipidemia    1. Recurrent chest pain Previous history of chest pain.  No current chest pain.  2. Essential hypertension, benign Blood pressure is elevated today.  At 140/84 he temporarily decreased his metoprolol at direction of PCP.  Advised him he can go back to 25 mg p.o. twice daily of metoprolol.  He states he was asymptomatic on the higher dose.  He states the only reason his PCP stopped the medication was due to slow heart rate.  3. Mixed hyperlipidemia Lipid panel on 05/10/2020 TC 204, HDL 26, TG 250, LDL 140.  Starting Zetia was discussed in addition to PCSK9 at previous visit with Dr. Domenic Polite.  Patient stated he would let us know if he wanted to start lipid-lowering medication.  4.  Bradycardia EKG today sinus bradycardia rate of 55, left axis deviation.  He is asymptomatic.  Medication Adjustments/Labs and Tests Ordered: Current medicines are reviewed at length with the patient today.  Concerns regarding medicines are outlined above.   Disposition: Follow-up with Dr. Domenic Polite as needed  Signed, Levell July, NP 01/31/2021 11:33 AM    [facility] at [place], [place], [place] [address] Phone: [phone]; Fax: [phone]

## 2021-01-31 ENCOUNTER — Ambulatory Visit (INDEPENDENT_AMBULATORY_CARE_PROVIDER_SITE_OTHER): Payer: Medicare Other | Admitting: Family Medicine

## 2021-01-31 ENCOUNTER — Encounter: Payer: Self-pay | Admitting: Family Medicine

## 2021-01-31 VITALS — BP 140/84 | HR 58 | Ht 71.0 in | Wt 229.0 lb

## 2021-01-31 DIAGNOSIS — R079 Chest pain, unspecified: Secondary | ICD-10-CM | POA: Diagnosis not present

## 2021-01-31 DIAGNOSIS — E782 Mixed hyperlipidemia: Secondary | ICD-10-CM | POA: Diagnosis not present

## 2021-01-31 DIAGNOSIS — I10 Essential (primary) hypertension: Secondary | ICD-10-CM | POA: Diagnosis not present

## 2021-01-31 MED ORDER — METOPROLOL SUCCINATE ER 25 MG PO TB24: 25.0000 mg | ORAL_TABLET | Freq: Two times a day (BID) | ORAL | 1 refills | Status: AC

## 2021-01-31 NOTE — Patient Instructions (Addendum)
Medication Instructions:  Your physician has recommended you make the following change in your medication:  Go back to previous metoprolol tartrate dose of 25 mg twice daily Continue other medications the same  Labwork: none  Testing/Procedures: none  Follow-Up: Your physician recommends that you schedule a follow-up appointment in: as needed  Any Other Special Instructions Will Be Listed Below (If Applicable).  If you need a refill on your cardiac medications before your next appointment, please call your pharmacy.

## 2021-03-11 DIAGNOSIS — Z6832 Body mass index (BMI) 32.0-32.9, adult: Secondary | ICD-10-CM | POA: Diagnosis not present

## 2021-03-11 DIAGNOSIS — B029 Zoster without complications: Secondary | ICD-10-CM | POA: Diagnosis not present

## 2021-04-15 ENCOUNTER — Other Ambulatory Visit (INDEPENDENT_AMBULATORY_CARE_PROVIDER_SITE_OTHER): Payer: Self-pay | Admitting: Nurse Practitioner

## 2021-04-15 DIAGNOSIS — I10 Essential (primary) hypertension: Secondary | ICD-10-CM

## 2021-08-07 ENCOUNTER — Ambulatory Visit (INDEPENDENT_AMBULATORY_CARE_PROVIDER_SITE_OTHER): Payer: Medicare Other | Admitting: Nurse Practitioner

## 2021-08-12 DIAGNOSIS — I129 Hypertensive chronic kidney disease with stage 1 through stage 4 chronic kidney disease, or unspecified chronic kidney disease: Secondary | ICD-10-CM | POA: Diagnosis not present

## 2021-08-12 DIAGNOSIS — N1832 Chronic kidney disease, stage 3b: Secondary | ICD-10-CM | POA: Diagnosis not present

## 2021-12-03 DIAGNOSIS — Z1211 Encounter for screening for malignant neoplasm of colon: Secondary | ICD-10-CM | POA: Diagnosis not present

## 2021-12-03 DIAGNOSIS — K219 Gastro-esophageal reflux disease without esophagitis: Secondary | ICD-10-CM | POA: Diagnosis not present

## 2021-12-03 DIAGNOSIS — K5730 Diverticulosis of large intestine without perforation or abscess without bleeding: Secondary | ICD-10-CM | POA: Diagnosis not present

## 2021-12-03 DIAGNOSIS — K2270 Barrett's esophagus without dysplasia: Secondary | ICD-10-CM | POA: Diagnosis not present

## 2022-01-14 DIAGNOSIS — E559 Vitamin D deficiency, unspecified: Secondary | ICD-10-CM | POA: Diagnosis not present

## 2022-01-14 DIAGNOSIS — Z2821 Immunization not carried out because of patient refusal: Secondary | ICD-10-CM | POA: Diagnosis not present

## 2022-01-14 DIAGNOSIS — I10 Essential (primary) hypertension: Secondary | ICD-10-CM | POA: Diagnosis not present

## 2022-01-14 DIAGNOSIS — Z125 Encounter for screening for malignant neoplasm of prostate: Secondary | ICD-10-CM | POA: Diagnosis not present

## 2022-01-14 DIAGNOSIS — E6609 Other obesity due to excess calories: Secondary | ICD-10-CM | POA: Diagnosis not present

## 2022-01-14 DIAGNOSIS — Z6833 Body mass index (BMI) 33.0-33.9, adult: Secondary | ICD-10-CM | POA: Diagnosis not present

## 2022-01-14 DIAGNOSIS — K5730 Diverticulosis of large intestine without perforation or abscess without bleeding: Secondary | ICD-10-CM | POA: Diagnosis not present

## 2022-01-14 DIAGNOSIS — Z0001 Encounter for general adult medical examination with abnormal findings: Secondary | ICD-10-CM | POA: Diagnosis not present

## 2022-01-14 DIAGNOSIS — E785 Hyperlipidemia, unspecified: Secondary | ICD-10-CM | POA: Diagnosis not present

## 2022-01-14 DIAGNOSIS — Z1331 Encounter for screening for depression: Secondary | ICD-10-CM | POA: Diagnosis not present

## 2022-01-14 DIAGNOSIS — R7309 Other abnormal glucose: Secondary | ICD-10-CM | POA: Diagnosis not present

## 2022-02-19 DIAGNOSIS — K2270 Barrett's esophagus without dysplasia: Secondary | ICD-10-CM | POA: Diagnosis not present

## 2022-02-19 DIAGNOSIS — K2289 Other specified disease of esophagus: Secondary | ICD-10-CM | POA: Diagnosis not present

## 2022-02-19 DIAGNOSIS — K219 Gastro-esophageal reflux disease without esophagitis: Secondary | ICD-10-CM | POA: Diagnosis not present

## 2022-02-19 DIAGNOSIS — K635 Polyp of colon: Secondary | ICD-10-CM | POA: Diagnosis not present

## 2022-02-19 DIAGNOSIS — K5730 Diverticulosis of large intestine without perforation or abscess without bleeding: Secondary | ICD-10-CM | POA: Diagnosis not present

## 2022-02-19 DIAGNOSIS — Z1211 Encounter for screening for malignant neoplasm of colon: Secondary | ICD-10-CM | POA: Diagnosis not present

## 2022-02-19 DIAGNOSIS — K317 Polyp of stomach and duodenum: Secondary | ICD-10-CM | POA: Diagnosis not present

## 2022-02-19 DIAGNOSIS — D123 Benign neoplasm of transverse colon: Secondary | ICD-10-CM | POA: Diagnosis not present

## 2022-02-19 DIAGNOSIS — D124 Benign neoplasm of descending colon: Secondary | ICD-10-CM | POA: Diagnosis not present

## 2022-05-29 DIAGNOSIS — E6609 Other obesity due to excess calories: Secondary | ICD-10-CM | POA: Diagnosis not present

## 2022-05-29 DIAGNOSIS — I10 Essential (primary) hypertension: Secondary | ICD-10-CM | POA: Diagnosis not present

## 2022-08-18 DIAGNOSIS — N1831 Chronic kidney disease, stage 3a: Secondary | ICD-10-CM | POA: Diagnosis not present

## 2022-08-18 DIAGNOSIS — I129 Hypertensive chronic kidney disease with stage 1 through stage 4 chronic kidney disease, or unspecified chronic kidney disease: Secondary | ICD-10-CM | POA: Diagnosis not present

## 2022-08-20 IMAGING — CT CT HEART MORP W/ CTA COR W/ SCORE W/ CA W/CM &/OR W/O CM
4 of 7 series · 8 of 20 positions shown, 9 images · IV contrast (APPLIED)
Comparison: None.
COMPARISON: None.

Addendum:
EXAM:
OVER-READ INTERPRETATION  CT CHEST

The following report is an over-read performed by radiologist Dr.
Blondinacka Samsonaite [REDACTED] on 11/08/2020. This over-read
does not include interpretation of cardiac or coronary anatomy or
pathology. The coronary CTA interpretation by the cardiologist is
attached.
CLINICAL DATA: 70 Year-old Caucasian male
Cardiac/Coronary  CTA
TECHNIQUE: The patient was scanned on a [employer] scanner.

[Series 6: best diast 72 % · axial · 0.39mm/px · z∈[+1322,+1365]mm · 2 of 326 slices shown, 3 images]
[im 109/326  vessel]
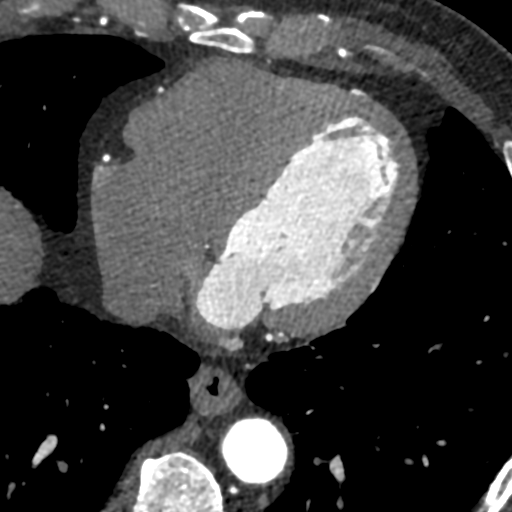
[im 109/326  lung]
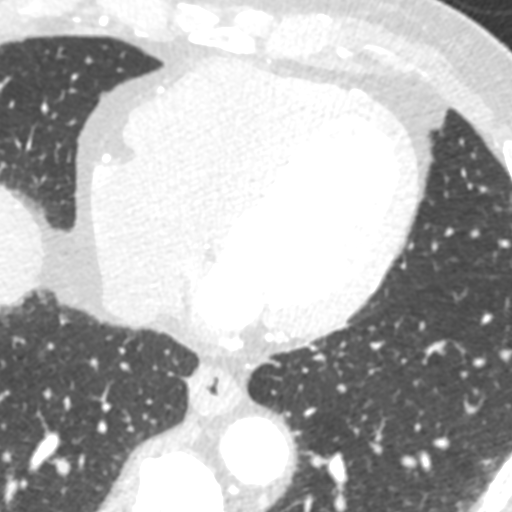
[im 217/326  vessel]
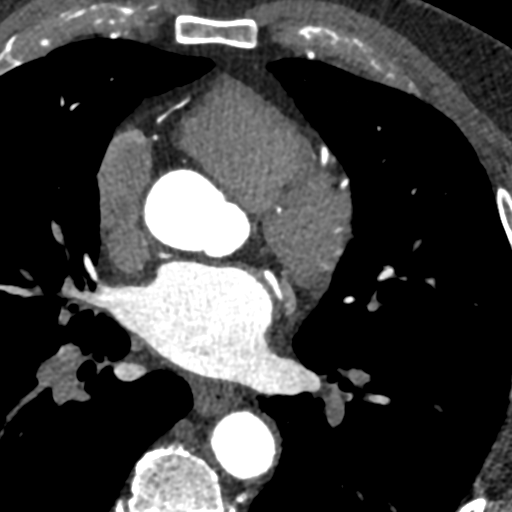

[Series 9: best syst 35 % · axial · 0.39mm/px · z∈[+1322,+1365]mm · 2 of 326 slices shown]
[im 109/326  vessel]
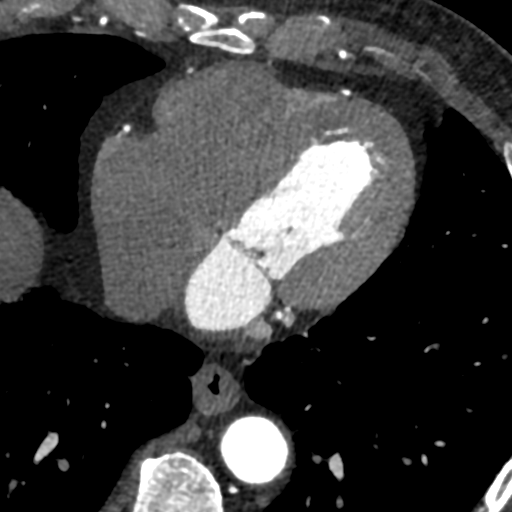
[im 217/326  vessel]
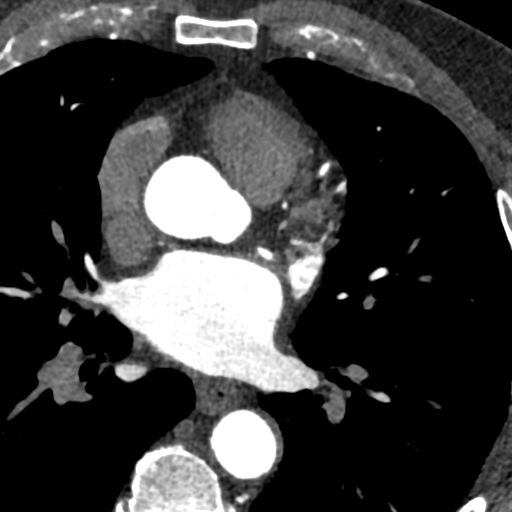

[Series 10: ts syst sharp 35 % · axial · 0.39mm/px · z∈[+1322,+1365]mm · 2 of 326 slices shown]
[im 109/326  lung]
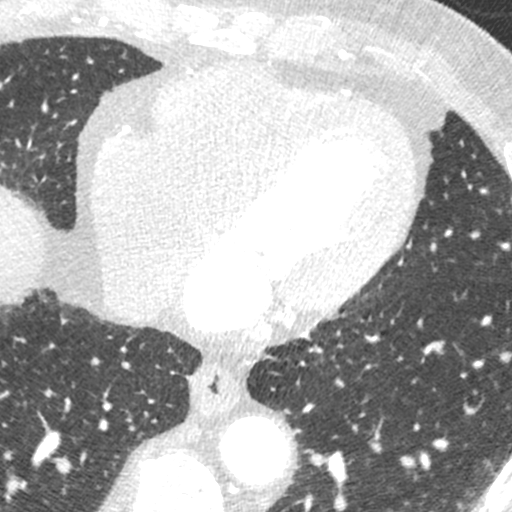
[im 217/326  lung]
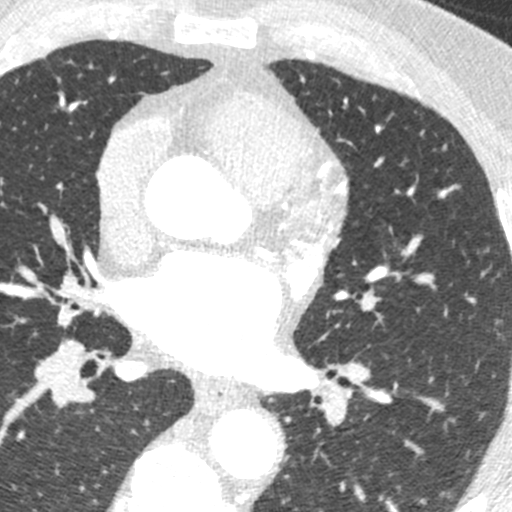

[Series 12: ts diast sharp 72 % · axial · 0.39mm/px · z∈[+1322,+1365]mm · 2 of 326 slices shown]
[im 109/326  lung]
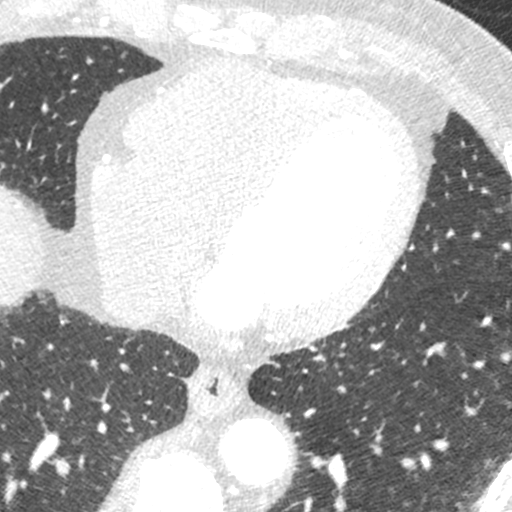
[im 217/326  lung]
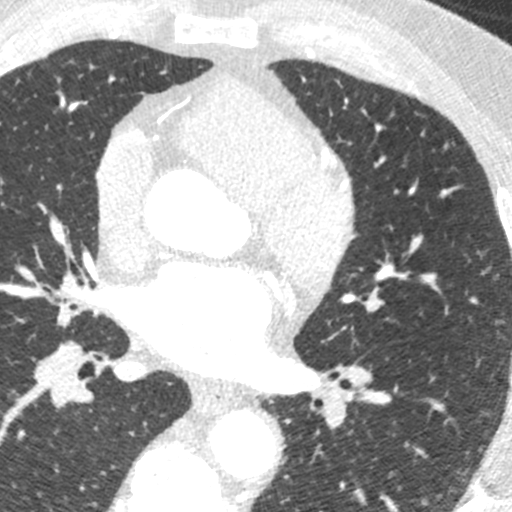

[8 of 20 positions shown; findings below may reference images not displayed]

FINDINGS: Vascular: Heart is normal size.  Aorta normal caliber.

Mediastinum/Nodes: No adenopathy

Lungs/Pleura: No confluent opacities or effusions.

Upper Abdomen: Imaging into the upper abdomen demonstrates no acute
findings.

Musculoskeletal: Chest wall soft tissues are unremarkable. No acute
bony abnormality.
IMPRESSION: No acute or significant extracardiac abnormality.
FINDINGS: A 100 kV prospective scan was triggered in the descending thoracic
aorta at 111 HU's. Axial non-contrast 3 mm slices were carried out
through the heart. The data set was analyzed on a dedicated work
station and scored using the Agatson method. Gantry rotation speed
was 250 msecs and collimation was .6 mm. No beta blockade and 0.8 mg
of sl NTG was given. The 3D data set was reconstructed in 5%
intervals of the 67-82 % of the R-R cycle. Diastolic phases were
analyzed on a dedicated work station using MPR, MIP and VRT modes.
The patient received 100 cc of contrast.

Aorta:  Normal size.  No calcifications.  No dissection.

Main Pulmonary Artery: Normal size of the pulmonary artery.

Aortic Valve:  Tri-leaflet.  Aortic valve calcium score 281.

Coronary Arteries:  Normal coronary origin.  Right dominance.

Coronary Calcium Score:

Left main: 0

Left anterior descending artery: 0

Left circumflex artery: 6

Right coronary artery: 0

Total: 0

Percentile: 20th for age, sex, and race matched control.

RCA is a large dominant artery that gives rise to PDA and PLA. There
is no plaque.

Left main is a large artery that gives rise to LAD and LCX arteries.
There is no plaque.

LAD is a large vessel that gives rise to one D1 Branch that
bifurcates. There is no plaque

LCX is a non-dominant artery that gives rise to one large OM1 branch
and one small OM2 vessel. There is a mild non-obstructive calcified
plaque in the proximal OM1.

There is a small ramus intermedius vessel that bifurcates.

Other findings:

Normal pulmonary vein drainage into the left atrium.

Normal left atrial appendage without a thrombus.

Cannot exclude small PFO.

Extra-cardiac findings: See attached radiology report for
non-cardiac structures.
IMPRESSION: 1. Coronary calcium score of 6. This was 20th percentile for age,
sex, and race matched control.

2. Normal coronary origin with right dominance.

3. Aortic valve calcium score 281- mild.

4. CAD-RADS 2. Mild non-obstructive CAD (25-49%). Consider
non-atherosclerotic causes of chest pain. Consider preventive
therapy and risk factor modification.

5. Cannot exclude small PFO.

RECOMMENDATIONS:



If CAC = 0, it is reasonable to withhold statin therapy and reassess
in 5 to 10 years, as long as higher risk conditions are absent
(diabetes mellitus, family history of premature CHD in first degree
relatives (males <55 years; females <65 years), cigarette smoking,
LDL >=190 mg/dL or other independent risk factors).

If CAC is 1 to 99, it is reasonable to initiate statin therapy for
patients >=55 years of age.

If CAC is >=100 or >=75th percentile, it is reasonable to initiate
statin therapy at any age.

Cardiology referral should be considered for patients with CAC
scores =400 or >=75th percentile.

*2529 AHA/ACC/AACVPR/AAPA/ABC/GIORGI/RTOYOTA/LESENYAMANG/[place]/KIRCH/JANAK/BEDOYA
Guideline on the Management of Blood Cholesterol: A Report of the
American College of Cardiology/American Heart Association Task Force
on Clinical Practice Guidelines. J Am Coll Cardiol.
6735;73(24):8833-8143.

*** End of Addendum ***
EXAM:
OVER-READ INTERPRETATION  CT CHEST

The following report is an over-read performed by radiologist Dr.
Blondinacka Samsonaite [REDACTED] on 11/08/2020. This over-read
does not include interpretation of cardiac or coronary anatomy or
pathology. The coronary CTA interpretation by the cardiologist is
attached.
FINDINGS: Vascular: Heart is normal size.  Aorta normal caliber.

Mediastinum/Nodes: No adenopathy

Lungs/Pleura: No confluent opacities or effusions.

Upper Abdomen: Imaging into the upper abdomen demonstrates no acute
findings.

Musculoskeletal: Chest wall soft tissues are unremarkable. No acute
bony abnormality.
IMPRESSION: No acute or significant extracardiac abnormality.

## 2022-10-01 DIAGNOSIS — F988 Other specified behavioral and emotional disorders with onset usually occurring in childhood and adolescence: Secondary | ICD-10-CM | POA: Diagnosis not present

## 2022-10-01 DIAGNOSIS — Z6835 Body mass index (BMI) 35.0-35.9, adult: Secondary | ICD-10-CM | POA: Diagnosis not present

## 2022-10-01 DIAGNOSIS — E6609 Other obesity due to excess calories: Secondary | ICD-10-CM | POA: Diagnosis not present

## 2022-10-01 DIAGNOSIS — I10 Essential (primary) hypertension: Secondary | ICD-10-CM | POA: Diagnosis not present

## 2023-02-13 DIAGNOSIS — S233XXA Sprain of ligaments of thoracic spine, initial encounter: Secondary | ICD-10-CM | POA: Diagnosis not present

## 2023-02-13 DIAGNOSIS — M47816 Spondylosis without myelopathy or radiculopathy, lumbar region: Secondary | ICD-10-CM | POA: Diagnosis not present

## 2023-02-13 DIAGNOSIS — M9902 Segmental and somatic dysfunction of thoracic region: Secondary | ICD-10-CM | POA: Diagnosis not present

## 2023-02-13 DIAGNOSIS — M9901 Segmental and somatic dysfunction of cervical region: Secondary | ICD-10-CM | POA: Diagnosis not present

## 2023-02-13 DIAGNOSIS — M47812 Spondylosis without myelopathy or radiculopathy, cervical region: Secondary | ICD-10-CM | POA: Diagnosis not present

## 2023-02-13 DIAGNOSIS — M9903 Segmental and somatic dysfunction of lumbar region: Secondary | ICD-10-CM | POA: Diagnosis not present

## 2023-02-13 DIAGNOSIS — S336XXA Sprain of sacroiliac joint, initial encounter: Secondary | ICD-10-CM | POA: Diagnosis not present

## 2023-02-18 DIAGNOSIS — M47812 Spondylosis without myelopathy or radiculopathy, cervical region: Secondary | ICD-10-CM | POA: Diagnosis not present

## 2023-02-18 DIAGNOSIS — S233XXA Sprain of ligaments of thoracic spine, initial encounter: Secondary | ICD-10-CM | POA: Diagnosis not present

## 2023-02-18 DIAGNOSIS — M9902 Segmental and somatic dysfunction of thoracic region: Secondary | ICD-10-CM | POA: Diagnosis not present

## 2023-02-18 DIAGNOSIS — M47816 Spondylosis without myelopathy or radiculopathy, lumbar region: Secondary | ICD-10-CM | POA: Diagnosis not present

## 2023-02-18 DIAGNOSIS — M9903 Segmental and somatic dysfunction of lumbar region: Secondary | ICD-10-CM | POA: Diagnosis not present

## 2023-02-18 DIAGNOSIS — S336XXA Sprain of sacroiliac joint, initial encounter: Secondary | ICD-10-CM | POA: Diagnosis not present

## 2023-02-18 DIAGNOSIS — M9901 Segmental and somatic dysfunction of cervical region: Secondary | ICD-10-CM | POA: Diagnosis not present

## 2023-02-20 DIAGNOSIS — S336XXA Sprain of sacroiliac joint, initial encounter: Secondary | ICD-10-CM | POA: Diagnosis not present

## 2023-02-20 DIAGNOSIS — M9902 Segmental and somatic dysfunction of thoracic region: Secondary | ICD-10-CM | POA: Diagnosis not present

## 2023-02-20 DIAGNOSIS — M47816 Spondylosis without myelopathy or radiculopathy, lumbar region: Secondary | ICD-10-CM | POA: Diagnosis not present

## 2023-02-20 DIAGNOSIS — M9903 Segmental and somatic dysfunction of lumbar region: Secondary | ICD-10-CM | POA: Diagnosis not present

## 2023-02-20 DIAGNOSIS — S233XXA Sprain of ligaments of thoracic spine, initial encounter: Secondary | ICD-10-CM | POA: Diagnosis not present

## 2023-02-20 DIAGNOSIS — M9901 Segmental and somatic dysfunction of cervical region: Secondary | ICD-10-CM | POA: Diagnosis not present

## 2023-02-20 DIAGNOSIS — M47812 Spondylosis without myelopathy or radiculopathy, cervical region: Secondary | ICD-10-CM | POA: Diagnosis not present

## 2023-02-26 DIAGNOSIS — S336XXA Sprain of sacroiliac joint, initial encounter: Secondary | ICD-10-CM | POA: Diagnosis not present

## 2023-02-26 DIAGNOSIS — M47816 Spondylosis without myelopathy or radiculopathy, lumbar region: Secondary | ICD-10-CM | POA: Diagnosis not present

## 2023-02-26 DIAGNOSIS — M9901 Segmental and somatic dysfunction of cervical region: Secondary | ICD-10-CM | POA: Diagnosis not present

## 2023-02-26 DIAGNOSIS — M9903 Segmental and somatic dysfunction of lumbar region: Secondary | ICD-10-CM | POA: Diagnosis not present

## 2023-02-26 DIAGNOSIS — S233XXA Sprain of ligaments of thoracic spine, initial encounter: Secondary | ICD-10-CM | POA: Diagnosis not present

## 2023-02-26 DIAGNOSIS — M9902 Segmental and somatic dysfunction of thoracic region: Secondary | ICD-10-CM | POA: Diagnosis not present

## 2023-02-26 DIAGNOSIS — M47812 Spondylosis without myelopathy or radiculopathy, cervical region: Secondary | ICD-10-CM | POA: Diagnosis not present

## 2023-02-27 DIAGNOSIS — M47816 Spondylosis without myelopathy or radiculopathy, lumbar region: Secondary | ICD-10-CM | POA: Diagnosis not present

## 2023-02-27 DIAGNOSIS — S336XXA Sprain of sacroiliac joint, initial encounter: Secondary | ICD-10-CM | POA: Diagnosis not present

## 2023-02-27 DIAGNOSIS — S233XXA Sprain of ligaments of thoracic spine, initial encounter: Secondary | ICD-10-CM | POA: Diagnosis not present

## 2023-02-27 DIAGNOSIS — M9901 Segmental and somatic dysfunction of cervical region: Secondary | ICD-10-CM | POA: Diagnosis not present

## 2023-02-27 DIAGNOSIS — M47812 Spondylosis without myelopathy or radiculopathy, cervical region: Secondary | ICD-10-CM | POA: Diagnosis not present

## 2023-02-27 DIAGNOSIS — M9903 Segmental and somatic dysfunction of lumbar region: Secondary | ICD-10-CM | POA: Diagnosis not present

## 2023-02-27 DIAGNOSIS — M9902 Segmental and somatic dysfunction of thoracic region: Secondary | ICD-10-CM | POA: Diagnosis not present

## 2023-03-02 DIAGNOSIS — M9902 Segmental and somatic dysfunction of thoracic region: Secondary | ICD-10-CM | POA: Diagnosis not present

## 2023-03-02 DIAGNOSIS — M47816 Spondylosis without myelopathy or radiculopathy, lumbar region: Secondary | ICD-10-CM | POA: Diagnosis not present

## 2023-03-02 DIAGNOSIS — M47812 Spondylosis without myelopathy or radiculopathy, cervical region: Secondary | ICD-10-CM | POA: Diagnosis not present

## 2023-03-02 DIAGNOSIS — S336XXA Sprain of sacroiliac joint, initial encounter: Secondary | ICD-10-CM | POA: Diagnosis not present

## 2023-03-02 DIAGNOSIS — S233XXA Sprain of ligaments of thoracic spine, initial encounter: Secondary | ICD-10-CM | POA: Diagnosis not present

## 2023-03-02 DIAGNOSIS — M9901 Segmental and somatic dysfunction of cervical region: Secondary | ICD-10-CM | POA: Diagnosis not present

## 2023-03-02 DIAGNOSIS — M9903 Segmental and somatic dysfunction of lumbar region: Secondary | ICD-10-CM | POA: Diagnosis not present

## 2023-03-09 DIAGNOSIS — S336XXA Sprain of sacroiliac joint, initial encounter: Secondary | ICD-10-CM | POA: Diagnosis not present

## 2023-03-09 DIAGNOSIS — M9903 Segmental and somatic dysfunction of lumbar region: Secondary | ICD-10-CM | POA: Diagnosis not present

## 2023-03-09 DIAGNOSIS — S233XXA Sprain of ligaments of thoracic spine, initial encounter: Secondary | ICD-10-CM | POA: Diagnosis not present

## 2023-03-09 DIAGNOSIS — M47812 Spondylosis without myelopathy or radiculopathy, cervical region: Secondary | ICD-10-CM | POA: Diagnosis not present

## 2023-03-09 DIAGNOSIS — M9901 Segmental and somatic dysfunction of cervical region: Secondary | ICD-10-CM | POA: Diagnosis not present

## 2023-03-09 DIAGNOSIS — M47816 Spondylosis without myelopathy or radiculopathy, lumbar region: Secondary | ICD-10-CM | POA: Diagnosis not present

## 2023-03-09 DIAGNOSIS — M9902 Segmental and somatic dysfunction of thoracic region: Secondary | ICD-10-CM | POA: Diagnosis not present

## 2023-03-11 DIAGNOSIS — M9903 Segmental and somatic dysfunction of lumbar region: Secondary | ICD-10-CM | POA: Diagnosis not present

## 2023-03-11 DIAGNOSIS — M9902 Segmental and somatic dysfunction of thoracic region: Secondary | ICD-10-CM | POA: Diagnosis not present

## 2023-03-11 DIAGNOSIS — M47816 Spondylosis without myelopathy or radiculopathy, lumbar region: Secondary | ICD-10-CM | POA: Diagnosis not present

## 2023-03-11 DIAGNOSIS — M9901 Segmental and somatic dysfunction of cervical region: Secondary | ICD-10-CM | POA: Diagnosis not present

## 2023-03-11 DIAGNOSIS — M47812 Spondylosis without myelopathy or radiculopathy, cervical region: Secondary | ICD-10-CM | POA: Diagnosis not present

## 2023-03-11 DIAGNOSIS — S233XXA Sprain of ligaments of thoracic spine, initial encounter: Secondary | ICD-10-CM | POA: Diagnosis not present

## 2023-03-11 DIAGNOSIS — S336XXA Sprain of sacroiliac joint, initial encounter: Secondary | ICD-10-CM | POA: Diagnosis not present

## 2023-03-31 DIAGNOSIS — L820 Inflamed seborrheic keratosis: Secondary | ICD-10-CM | POA: Diagnosis not present

## 2023-03-31 DIAGNOSIS — L570 Actinic keratosis: Secondary | ICD-10-CM | POA: Diagnosis not present

## 2023-03-31 DIAGNOSIS — L814 Other melanin hyperpigmentation: Secondary | ICD-10-CM | POA: Diagnosis not present

## 2023-03-31 DIAGNOSIS — D2372 Other benign neoplasm of skin of left lower limb, including hip: Secondary | ICD-10-CM | POA: Diagnosis not present

## 2023-03-31 DIAGNOSIS — D1801 Hemangioma of skin and subcutaneous tissue: Secondary | ICD-10-CM | POA: Diagnosis not present

## 2023-03-31 DIAGNOSIS — L821 Other seborrheic keratosis: Secondary | ICD-10-CM | POA: Diagnosis not present

## 2023-03-31 DIAGNOSIS — D225 Melanocytic nevi of trunk: Secondary | ICD-10-CM | POA: Diagnosis not present

## 2023-03-31 DIAGNOSIS — D692 Other nonthrombocytopenic purpura: Secondary | ICD-10-CM | POA: Diagnosis not present

## 2023-04-24 DIAGNOSIS — E6609 Other obesity due to excess calories: Secondary | ICD-10-CM | POA: Diagnosis not present

## 2023-04-24 DIAGNOSIS — M79604 Pain in right leg: Secondary | ICD-10-CM | POA: Diagnosis not present

## 2023-04-24 DIAGNOSIS — R7309 Other abnormal glucose: Secondary | ICD-10-CM | POA: Diagnosis not present

## 2023-04-24 DIAGNOSIS — Z6834 Body mass index (BMI) 34.0-34.9, adult: Secondary | ICD-10-CM | POA: Diagnosis not present

## 2023-06-23 DIAGNOSIS — M47816 Spondylosis without myelopathy or radiculopathy, lumbar region: Secondary | ICD-10-CM | POA: Diagnosis not present

## 2023-06-23 DIAGNOSIS — M9901 Segmental and somatic dysfunction of cervical region: Secondary | ICD-10-CM | POA: Diagnosis not present

## 2023-06-23 DIAGNOSIS — M47812 Spondylosis without myelopathy or radiculopathy, cervical region: Secondary | ICD-10-CM | POA: Diagnosis not present

## 2023-06-23 DIAGNOSIS — M9902 Segmental and somatic dysfunction of thoracic region: Secondary | ICD-10-CM | POA: Diagnosis not present

## 2023-06-23 DIAGNOSIS — S336XXA Sprain of sacroiliac joint, initial encounter: Secondary | ICD-10-CM | POA: Diagnosis not present

## 2023-06-23 DIAGNOSIS — M9903 Segmental and somatic dysfunction of lumbar region: Secondary | ICD-10-CM | POA: Diagnosis not present

## 2023-06-23 DIAGNOSIS — S233XXA Sprain of ligaments of thoracic spine, initial encounter: Secondary | ICD-10-CM | POA: Diagnosis not present

## 2023-06-24 DIAGNOSIS — M47816 Spondylosis without myelopathy or radiculopathy, lumbar region: Secondary | ICD-10-CM | POA: Diagnosis not present

## 2023-06-24 DIAGNOSIS — M47812 Spondylosis without myelopathy or radiculopathy, cervical region: Secondary | ICD-10-CM | POA: Diagnosis not present

## 2023-06-24 DIAGNOSIS — S233XXA Sprain of ligaments of thoracic spine, initial encounter: Secondary | ICD-10-CM | POA: Diagnosis not present

## 2023-06-24 DIAGNOSIS — S336XXA Sprain of sacroiliac joint, initial encounter: Secondary | ICD-10-CM | POA: Diagnosis not present

## 2023-06-24 DIAGNOSIS — M9902 Segmental and somatic dysfunction of thoracic region: Secondary | ICD-10-CM | POA: Diagnosis not present

## 2023-06-24 DIAGNOSIS — M9901 Segmental and somatic dysfunction of cervical region: Secondary | ICD-10-CM | POA: Diagnosis not present

## 2023-06-24 DIAGNOSIS — M9903 Segmental and somatic dysfunction of lumbar region: Secondary | ICD-10-CM | POA: Diagnosis not present

## 2023-07-08 DIAGNOSIS — H5203 Hypermetropia, bilateral: Secondary | ICD-10-CM | POA: Diagnosis not present

## 2023-07-13 DIAGNOSIS — Z0100 Encounter for examination of eyes and vision without abnormal findings: Secondary | ICD-10-CM | POA: Diagnosis not present

## 2024-04-10 ENCOUNTER — Emergency Department (HOSPITAL_COMMUNITY)

## 2024-04-10 ENCOUNTER — Encounter (HOSPITAL_COMMUNITY): Payer: Self-pay

## 2024-04-10 ENCOUNTER — Other Ambulatory Visit: Payer: Self-pay

## 2024-04-10 ENCOUNTER — Emergency Department (HOSPITAL_COMMUNITY)
Admission: EM | Admit: 2024-04-10 | Discharge: 2024-04-10 | Disposition: A | Discharge status: Home / Self Care | Attending: Emergency Medicine | Admitting: Emergency Medicine

## 2024-04-10 DIAGNOSIS — Z79899 Other long term (current) drug therapy: Secondary | ICD-10-CM | POA: Insufficient documentation

## 2024-04-10 DIAGNOSIS — I10 Essential (primary) hypertension: Secondary | ICD-10-CM | POA: Diagnosis not present

## 2024-04-10 DIAGNOSIS — R059 Cough, unspecified: Secondary | ICD-10-CM | POA: Diagnosis present

## 2024-04-10 DIAGNOSIS — J101 Influenza due to other identified influenza virus with other respiratory manifestations: Secondary | ICD-10-CM | POA: Diagnosis not present

## 2024-04-10 LAB — BASIC METABOLIC PANEL WITH GFR
Anion gap: 17 — ABNORMAL HIGH (ref 5–15)
BUN: 17 mg/dL (ref 8–23)
CO2: 21 mmol/L — ABNORMAL LOW (ref 22–32)
Calcium: 9.2 mg/dL (ref 8.9–10.3)
Chloride: 98 mmol/L (ref 98–111)
Creatinine, Ser: 1.3 mg/dL — ABNORMAL HIGH (ref 0.61–1.24)
GFR, Estimated: 58 mL/min — ABNORMAL LOW
Glucose, Bld: 98 mg/dL (ref 70–99)
Potassium: 4.5 mmol/L (ref 3.5–5.1)
Sodium: 136 mmol/L (ref 135–145)

## 2024-04-10 LAB — CBC
HCT: 46.8 % (ref 39.0–52.0)
Hemoglobin: 15.5 g/dL (ref 13.0–17.0)
MCH: 30.6 pg (ref 26.0–34.0)
MCHC: 33.1 g/dL (ref 30.0–36.0)
MCV: 92.5 fL (ref 80.0–100.0)
Platelets: 186 K/uL (ref 150–400)
RBC: 5.06 MIL/uL (ref 4.22–5.81)
RDW: 12.7 % (ref 11.5–15.5)
WBC: 7.9 K/uL (ref 4.0–10.5)
nRBC: 0 % (ref 0.0–0.2)

## 2024-04-10 LAB — TROPONIN T, HIGH SENSITIVITY: Troponin T High Sensitivity: <15 ng/L (ref 0–19)

## 2024-04-10 LAB — RESP PANEL BY RT-PCR (RSV, FLU A&B, COVID)  RVPGX2
Influenza A by PCR: POSITIVE — AB
Influenza B by PCR: NEGATIVE
Resp Syncytial Virus by PCR: NEGATIVE
SARS Coronavirus 2 by RT PCR: NEGATIVE

## 2024-04-10 MED ORDER — BENZONATATE 100 MG PO CAPS
200.0000 mg | ORAL_CAPSULE | Freq: Once | ORAL | Status: AC
  Administered 2024-04-10: 200 mg via ORAL
  Filled 2024-04-10: qty 2

## 2024-04-10 MED ORDER — PREDNISONE 50 MG PO TABS
60.0000 mg | ORAL_TABLET | Freq: Once | ORAL | Status: AC
  Administered 2024-04-10: 60 mg via ORAL
  Filled 2024-04-10: qty 1

## 2024-04-10 MED ORDER — ONDANSETRON HCL 4 MG PO TABS: 4.0000 mg | ORAL_TABLET | Freq: Three times a day (TID) | ORAL | 0 refills | Status: AC | PRN

## 2024-04-10 MED ORDER — BENZONATATE 100 MG PO CAPS: 200.0000 mg | ORAL_CAPSULE | Freq: Three times a day (TID) | ORAL | 0 refills | Status: AC | PRN

## 2024-04-10 MED ORDER — ONDANSETRON HCL 4 MG PO TABS: 4.0000 mg | ORAL_TABLET | Freq: Three times a day (TID) | ORAL | 0 refills | Status: DC | PRN

## 2024-04-10 MED ORDER — PREDNISONE 50 MG PO TABS: 50.0000 mg | ORAL_TABLET | Freq: Every day | ORAL | 0 refills | Status: AC

## 2024-04-10 MED ORDER — PREDNISONE 50 MG PO TABS: 50.0000 mg | ORAL_TABLET | Freq: Every day | ORAL | 0 refills | Status: DC

## 2024-04-10 MED ORDER — BENZONATATE 100 MG PO CAPS: 200.0000 mg | ORAL_CAPSULE | Freq: Three times a day (TID) | ORAL | 0 refills | Status: DC | PRN

## 2024-04-10 NOTE — ED Provider Notes (Signed)
 " [place] EMERGENCY DEPARTMENT AT [facility] Provider Note   CSN: [id number] Arrival date & time: 04/10/24  1333     Patient presents with: Cough   Matthew Cohen is a 73 y.o. male.  {Add pertinent medical, surgical, social history, OB history to YEP:67052} The history is provided by the patient.       Prior to Admission medications  Medication Sig Start Date End Date Taking? Authorizing Provider  benzonatate  (TESSALON ) 100 MG capsule Take 2 capsules (200 mg total) by mouth 3 (three) times daily as needed. 04/10/24  Yes Harbor Paster, PA-C  ondansetron  (ZOFRAN ) 4 MG tablet Take 1 tablet (4 mg total) by mouth every 8 (eight) hours as needed for nausea or vomiting. 04/10/24  Yes Chaun Uemura, PA-C  predniSONE  (DELTASONE ) 50 MG tablet Take 1 tablet (50 mg total) by mouth daily with breakfast for 4 days. 04/10/24 04/14/24 Yes Aidric Endicott, PA-C  chlorthalidone  (HYGROTON ) 25 MG tablet Take 0.5 tablets (12.5 mg total) by mouth every morning. 08/27/20   Gosrani, Nimish C, MD  Cholecalciferol 125 MCG (5000 UT) TABS Take 2 tablets by mouth.    [provider]  metoprolol  succinate (TOPROL -XL) 25 MG 24 hr tablet Take 1 tablet (25 mg total) by mouth 2 (two) times daily. 01/31/21   Richarda Prentice LITTIE Mickey., NP  omeprazole  (PRILOSEC) 40 MG capsule Take 1 capsule (40 mg total) by mouth daily. 07/26/20   Elnor Lauraine BRAVO, NP    Allergies: Patient has no known allergies.    Review of Systems  Updated Vital Signs BP (!) 146/88 (BP Location: Left Arm)   Pulse 87   Temp 99.1 F (37.3 C) (Oral)   Resp 16   Ht 5' 11 (1.803 m)   Wt 101.2 kg   SpO2 96%   BMI 31.10 kg/m   Physical Exam  (all labs ordered are listed, but only abnormal results are displayed) Labs Reviewed  RESP PANEL BY RT-PCR (RSV, FLU A&B, COVID)  RVPGX2 - Abnormal; Notable for the following components:      Result Value   Influenza A by PCR POSITIVE (*)    All other components within normal limits  BASIC  METABOLIC PANEL WITH GFR - Abnormal; Notable for the following components:   CO2 21 (*)    Creatinine, Ser 1.30 (*)    GFR, Estimated 58 (*)    Anion gap 17 (*)    All other components within normal limits  CBC  TROPONIN T, HIGH SENSITIVITY  TROPONIN T, HIGH SENSITIVITY    EKG: None  Radiology: DG Chest 2 View Result Date: 04/10/2024 CLINICAL DATA:  Cough with chills, headache, body aches, chest pain and shortness of breath. EXAM: CHEST - 2 VIEW COMPARISON:  None Available. FINDINGS: The heart size and mediastinal contours are within normal limits. Mildly increased suprahilar and infrahilar lung markings are noted, bilaterally. No acute infiltrate, pleural effusion or pneumothorax is identified. The visualized skeletal structures are unremarkable. IMPRESSION: Findings which may represent mild bronchitis. Electronically Signed   By: Suzen Dials M.D.   On: 04/10/2024 16:42    {Document cardiac monitor, telemetry assessment procedure when appropriate:32947} Procedures   Medications Ordered in the ED  benzonatate  (TESSALON ) capsule 200 mg (200 mg Oral Given 04/10/24 1802)  predniSONE  (DELTASONE ) tablet 60 mg (60 mg Oral Given 04/10/24 1802)      {Click here for ABCD2, HEART and other calculators REFRESH Note before signing:1}  Medical Decision Making Amount and/or Complexity of Data Reviewed Labs: ordered. Radiology: ordered.  Risk Prescription drug management.   ***  {Document critical care time when appropriate  Document review of labs and clinical decision tools ie CHADS2VASC2, etc  Document your independent review of radiology images and any outside records  Document your discussion with family members, caretakers and with consultants  Document social determinants of health affecting pt's care  Document your decision making why or why not admission, treatments were needed:32947:::1}   Final diagnoses:  Influenza A    ED Discharge  Orders          Ordered    benzonatate  (TESSALON ) 100 MG capsule  3 times daily PRN        04/10/24 1754    predniSONE  (DELTASONE ) 50 MG tablet  Daily with breakfast        04/10/24 1754    ondansetron  (ZOFRAN ) 4 MG tablet  Every 8 hours PRN        04/10/24 1754             "

## 2024-04-10 NOTE — ED Triage Notes (Addendum)
 Pt presents with cough, chills, HA, body aches, CP, and ShOB for the last 4 days. Denies sick contacts. Pt has not had flu or COVID19 vaccine this season.

## 2024-04-10 NOTE — Discharge Instructions (Signed)
 Rest make sure you are drinking plenty of fluids.  I have prescribed you medications to help you with your cough and to help you with the painful coughing (prednisone ) -take your next dose of this prednisone  tomorrow morning as you have had today's dose here.  Get rechecked if you develop any increasing shortness of breath, weakness, uncontrolled nausea or vomiting or any new or worrisome symptoms

## 2024-05-10 NOTE — Progress Notes (Signed)
 Matthew Cohen                                          MRN: [id number]   05/10/2024   The [facility] Quality Team Specialist reviewed this patient medical record for the purposes of chart review for care gap closure. The following were reviewed: chart review for care gap closure-controlling blood pressure.    [facility] Quality Team
